# Patient Record
Sex: Male | Born: 1950
Health system: Southern US, Community
[De-identification: ages and names within clinical notes are randomized; demographics above are authoritative.]

## PROBLEM LIST (undated history)

## (undated) HISTORY — PX: FRACTURE SURGERY: SHX138

## (undated) HISTORY — PX: COLONOSCOPY: SHX174

## (undated) HISTORY — PX: TONSILLECTOMY: SUR1361

---

## 2007-10-11 ENCOUNTER — Ambulatory Visit (HOSPITAL_BASED_OUTPATIENT_CLINIC_OR_DEPARTMENT_OTHER): Admission: RE | Admit: 2007-10-11 | Discharge: 2007-10-12 | Payer: Self-pay | Admitting: Orthopedic Surgery

## 2009-11-20 ENCOUNTER — Observation Stay (HOSPITAL_COMMUNITY): Admission: EM | Admit: 2009-11-20 | Discharge: 2009-11-21 | Payer: Self-pay | Admitting: Emergency Medicine

## 2009-11-20 ENCOUNTER — Encounter (INDEPENDENT_AMBULATORY_CARE_PROVIDER_SITE_OTHER): Payer: Self-pay | Admitting: Internal Medicine

## 2009-11-21 ENCOUNTER — Ambulatory Visit: Payer: Self-pay | Admitting: Surgery

## 2009-11-21 ENCOUNTER — Encounter (INDEPENDENT_AMBULATORY_CARE_PROVIDER_SITE_OTHER): Payer: Self-pay | Admitting: Internal Medicine

## 2011-03-23 LAB — LIPID PANEL
Total CHOL/HDL Ratio: 5.3 RATIO
VLDL: 32 mg/dL (ref 0–40)

## 2011-03-23 LAB — BASIC METABOLIC PANEL
CO2: 28 mEq/L (ref 19–32)
GFR calc non Af Amer: >60 mL/min (ref >60)
Glucose, Bld: 120 mg/dL — ABNORMAL HIGH (ref 70–99)
Potassium: 4.4 mEq/L (ref 3.5–5.1)
Sodium: 136 mEq/L (ref 135–145)

## 2011-03-23 LAB — CARDIAC PANEL(CRET KIN+CKTOT+MB+TROPI)
CK, MB: 2.7 ng/mL (ref 0.3–4.0)
CK, MB: 3.5 ng/mL (ref 0.3–4.0)
Relative Index: 0.6 (ref 0.0–2.5)
Total CK: 507 U/L — ABNORMAL HIGH (ref 7–232)
Total CK: 563 U/L — ABNORMAL HIGH (ref 7–232)

## 2011-03-23 LAB — CBC
Hemoglobin: 14.8 g/dL (ref 13.0–17.0)
MCHC: 34.7 g/dL (ref 30.0–36.0)
RBC: 4.45 MIL/uL (ref 4.22–5.81)
WBC: 12.2 10*3/uL — ABNORMAL HIGH (ref 4.0–10.5)

## 2011-03-23 LAB — DIFFERENTIAL
Basophils Relative: 0 % (ref 0–1)
Lymphs Abs: 1 10*3/uL (ref 0.7–4.0)
Monocytes Absolute: 0.5 10*3/uL (ref 0.1–1.0)
Monocytes Relative: 5 % (ref 3–12)
Neutro Abs: 10.5 10*3/uL — ABNORMAL HIGH (ref 1.7–7.7)

## 2011-05-04 NOTE — Op Note (Signed)
NAME:  Reginald Bauer, Reginald Bauer                 ACCOUNT NO.:  [id number]   MEDICAL RECORD NO.:  [id number]          PATIENT TYPE:  AMB   LOCATION:  DSC                          FACILITY:  [facility]   PHYSICIAN:  Leonides Grills, M.D.     DATE OF BIRTH:  1951-05-21   DATE OF PROCEDURE:  10/11/2007  DATE OF DISCHARGE:                               OPERATIVE REPORT   PREOPERATIVE DIAGNOSES:  1. Left fibular malunion.  2. Left tibial malunion.  3. Loose body left anterolateral ankle.  4. Anterior ankle impingement secondary to anterior distal tibial      osteophytes.   POSTOPERATIVE DIAGNOSES:  1. Left fibular malunion.  2. Left tibial malunion.  3. Loose body left anterolateral ankle.  4. Anterior ankle impingement secondary to anterior distal tibial      osteophytes.   OPERATION:  1. Left Weber closing wedge tib-fib osteotomy with internal fixation.  2. Stress x-rays left ankle.  3. Left ankle arthroscopy.  4. Left ankle arthrotomy with local tenosynovectomy and loose body      removal.  5. Excision anterior distal tibial osteophytes.  6. Local bone graft that was obtained from the osteotomy.  7. Excision talar dorsal talar spur.  8. Dorsal talar spur.   ANESTHESIA:  General.   SURGEON:  Leonides Grills, MD   ASSISTANT:  Evlyn Kanner, P.A.   ESTIMATED BLOOD LOSS:  Minimal.   TOURNIQUET TIME:  2 hours.   COMPLICATIONS:  None.   DISPOSITION:  Stable to PR.   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  He has been walking on this side of his foot almost his  entire life.  He was consented for the above procedure.  All risks which  infection, nerve vessel injury, nonunion, malunion, hardware irritation,  hardware failure, persistent pain, worse pain, prolonged recovery,  stiffness, arthritis and possible future  fusion versus arthroplasty  again were all explained, questions were encouraged and answered.  We  also preoperatively prepped this with tracing paper which was  approximately 1/2 hour to 45 minutes preparation to determine that he  needed approximately 26 degree correction within the tib-fib osteotomy.   PROCEDURE:  The patient brought to the operating room, placed in supine  position after adequate general G tube anesthesia was administered with  popliteal block as well as Ancef gram IV piggyback.  Left lower  extremity was prepped and draped sterile manner over a proximally thigh  tourniquet.  The patient was then placed in a sloppy lateral position  with the operative side up on a beanbag.  All bony props well padded.  Left lower extremity is prepped, draped sterile manner over proximally  placed thigh tourniquet.  Limb was gravity exsanguinated.  Tourniquet  elevated 290 mmHg.  A longitudinal incision over the fibula including  the lateral malleolus was then made.  Dissection was carried down  through skin.  Hemostasis was obtained.  Dissection  was carried down  directly to bone and under C-arm guidance the area of osteotomy was  determined first by determining the joint line and then 2 cm proximal to  this, our first 2 mm K-wire was then placed under C-arm guidance.  We  then from the preoperative plan determined that 26 mm proximal to this a  second wire would be placed.  Again this was a divergent wire and this  was verified under C-arm guidance to be in the proper position.  We then  elevated the soft tissues both anteriorly and posteriorly off tib-fib  area, placed Homans and then with a saw, osteotomized the tibia and  fibula.  This took some time to get this perfectly aligned in both the  AP and lateral planes.  We used a second saw blade to verify that both  cuts were made in the right plane.  Once this was done, the sides were  perfect and apposed excellent.  We then made a  longitudinal incision  over the apex of the osteotomy medially.  Dissection was carried down  through skin.  Hemostasis was obtained.  Saphenous nerve and vein were  identified and protected.  A two hole one-third tubular plate was then  applied as a spring plate with anticipation that the medial cortex would  fracture with the correction.  Once this was applied, two 3.5 mm fully  threaded cortical set screws were then placed in a divergent manner.  We  then closed down the osteotomy with two-point reduction clamp.  This  came together beautifully and was in excellent apposition.  Due to the  fact we did this at the metaphyseal flare, the contact points were  excellent over the lateral and anterior cortex.  We released the portion  of the syndesmosis of the proximal fibula so that we could appose the  fibula in proper position.  This was done with a two-point reduction  clamp as well.  We then applied a four-hole stacked one-third tubular  plate over the lateral malleolus fibula area.  We then placed four 3.5-  mm fully threaded cortical set screws using 4.5-mm drill hole  respectively.  This was placed in a compression manner.  This compressed  the fibula nicely and held the bones well apposed.  We then removed the  clamp off the tibial surface and then placed a three-hole one third  tubular plate over this area as well.  This was then again placed a  compression manner and held the osteotomy compressed in an anatomic  position.  We then tightened down the medial plate and again held  everything in a beautiful position.  We then copiously irrigated the  area with normal saline.  This took approximately two hours.  We  released the tourniquet.  There was no pulsatile bleeding.  There was a  palpable dorsalis pedis pulse and a posterior tib pulse.  The left foot  was warm and pink. Local bone graft that was obtained from the osteotomy  was crushed on the back table as well as bone we took  from the later in  the anterior tibial osteotomy, anterior tibial spur removal, as well as  loose body removal, as well.  We placed this on back table and later  placed this at the graft site.  We then marked out the anatomical  landmarks which included anterior tibialis and peroneus tertius and  palpated over where the loose body as on the anterolateral aspect of the  ankle.  Weston Brass and spread technique was then utilized to create the  anteromedial, anterolateral portals. An arthroscope was placed to verify  extent of the spur and also to verify the loose body itself.  We did not  perform an extensive.  This was a minimal debridement with the scope by  also extending the anterolateral wound approximately to 2.5 cm.  Once  this was done using the scope, we were able within the wound, we were  able to remove not only the anterolateral loose body but also removed  the anterior distal tibial spurs as well with a curved quarter inch  osteotome.  This was done and as well as an open tenosynovectomy of the  ankle with a synovectomy rongeur.  We then ranged the ankle and the  range of motion was excellent.  There was no impinging areas anteriorly.  There was also a dorsal talar spur medially that was removed with the  curved quarter inch osteotome as well.  Once this was done, the area was  copiously be with normal saline from the scope and this was visually was  also verified visually as well.  Stress x-rays were obtained prior to  this and showed no gross motion across the osteotomy site fixation,  proper position excellent alignment as well.  Clinically the leg was in  excellent position.  The heel was in excellent position.  We did not  require any further osteotomies at this point.  He had very supple foot  especially the subtalar joint and first ray area.  He did not have a  significant or any plantar flexed first ray that would require osteotomy  at this point we then took the bone graft  that we obtained locally from  the above areas and put stress strain relieving bone graft over the  osteotomy sites as well.  This was packed into place after the areas  were copiously irrigated with normal saline.  Capsule anterolaterally  was closed with 3-0 Vicryl, protecting the neurovascular structures  medially and peroneus tertius tendon.  Subcu was closed with 2-0 and 3-0  Vicryl over all wounds.  Skin was closed 4-0 nylon over wounds.  Sterile  dressing was applied.  Modified Jones dressing was applied with the  ankle in neutral dorsiflexion.  The patient was stable to PR.      Leonides Grills, M.D.  Electronically Signed     PB/MEDQ  D:  10/11/2007  T:  10/12/2007  Job:  [id number]

## 2011-09-29 LAB — POCT HEMOGLOBIN-HEMACUE
Hemoglobin: 15.9
Operator id: 128471

## 2014-10-31 ENCOUNTER — Emergency Department (HOSPITAL_COMMUNITY): Payer: BC Managed Care – PPO

## 2014-10-31 ENCOUNTER — Emergency Department (HOSPITAL_COMMUNITY)
Admission: EM | Admit: 2014-10-31 | Discharge: 2014-10-31 | Disposition: A | Discharge status: Home / Self Care | Payer: BC Managed Care – PPO | Attending: Emergency Medicine | Admitting: Emergency Medicine

## 2014-10-31 ENCOUNTER — Encounter (HOSPITAL_COMMUNITY): Payer: Self-pay | Admitting: Emergency Medicine

## 2014-10-31 DIAGNOSIS — Z79899 Other long term (current) drug therapy: Secondary | ICD-10-CM | POA: Diagnosis not present

## 2014-10-31 DIAGNOSIS — R42 Dizziness and giddiness: Secondary | ICD-10-CM | POA: Diagnosis not present

## 2014-10-31 DIAGNOSIS — R55 Syncope and collapse: Secondary | ICD-10-CM | POA: Insufficient documentation

## 2014-10-31 DIAGNOSIS — Z8781 Personal history of (healed) traumatic fracture: Secondary | ICD-10-CM | POA: Insufficient documentation

## 2014-10-31 DIAGNOSIS — Z7982 Long term (current) use of aspirin: Secondary | ICD-10-CM | POA: Diagnosis not present

## 2014-10-31 DIAGNOSIS — Z880 Allergy status to penicillin: Secondary | ICD-10-CM | POA: Diagnosis not present

## 2014-10-31 DIAGNOSIS — R110 Nausea: Secondary | ICD-10-CM | POA: Insufficient documentation

## 2014-10-31 DIAGNOSIS — R1010 Upper abdominal pain, unspecified: Secondary | ICD-10-CM | POA: Diagnosis not present

## 2014-10-31 DIAGNOSIS — Z9889 Other specified postprocedural states: Secondary | ICD-10-CM | POA: Insufficient documentation

## 2014-10-31 DIAGNOSIS — Z9089 Acquired absence of other organs: Secondary | ICD-10-CM | POA: Diagnosis not present

## 2014-10-31 DIAGNOSIS — R109 Unspecified abdominal pain: Secondary | ICD-10-CM

## 2014-10-31 LAB — URINALYSIS, ROUTINE W REFLEX MICROSCOPIC
Bilirubin Urine: NEGATIVE
GLUCOSE, UA: NEGATIVE mg/dL
HGB URINE DIPSTICK: NEGATIVE
KETONES UR: 40 mg/dL — AB
LEUKOCYTES UA: NEGATIVE
Nitrite: NEGATIVE
PROTEIN: NEGATIVE mg/dL
Specific Gravity, Urine: 1.021 (ref 1.005–1.030)
Urobilinogen, UA: 0.2 mg/dL (ref 0.0–1.0)
pH: 5 (ref 5.0–8.0)

## 2014-10-31 LAB — CBC WITH DIFFERENTIAL/PLATELET
Basophils Absolute: 0 10*3/uL (ref 0.0–0.1)
Basophils Relative: 0 % (ref 0–1)
EOS ABS: 0 10*3/uL (ref 0.0–0.7)
EOS PCT: 0 % (ref 0–5)
HEMATOCRIT: 41.9 % (ref 39.0–52.0)
HEMOGLOBIN: 14.2 g/dL (ref 13.0–17.0)
LYMPHS ABS: 0.7 10*3/uL (ref 0.7–4.0)
Lymphocytes Relative: 8 % — ABNORMAL LOW (ref 12–46)
MCH: 30.7 pg (ref 26.0–34.0)
MCHC: 33.9 g/dL (ref 30.0–36.0)
MCV: 90.5 fL (ref 78.0–100.0)
MONOS PCT: 7 % (ref 3–12)
Monocytes Absolute: 0.5 10*3/uL (ref 0.1–1.0)
NEUTROS PCT: 85 % — AB (ref 43–77)
Neutro Abs: 6.8 10*3/uL (ref 1.7–7.7)
Platelets: 255 10*3/uL (ref 150–400)
RBC: 4.63 MIL/uL (ref 4.22–5.81)
RDW: 13.1 % (ref 11.5–15.5)
WBC: 8.1 10*3/uL (ref 4.0–10.5)

## 2014-10-31 LAB — COMPREHENSIVE METABOLIC PANEL
ALK PHOS: 74 U/L (ref 39–117)
ALT: 16 U/L (ref 0–53)
ANION GAP: 14 (ref 5–15)
AST: 19 U/L (ref 0–37)
Albumin: 3.8 g/dL (ref 3.5–5.2)
BUN: 14 mg/dL (ref 6–23)
CO2: 25 mEq/L (ref 19–32)
Calcium: 9.5 mg/dL (ref 8.4–10.5)
Chloride: 102 mEq/L (ref 96–112)
Creatinine, Ser: 0.98 mg/dL (ref 0.50–1.35)
GFR calc non Af Amer: 85 mL/min — ABNORMAL LOW (ref >90)
GLUCOSE: 106 mg/dL — AB (ref 70–99)
POTASSIUM: 4.3 meq/L (ref 3.7–5.3)
Sodium: 141 mEq/L (ref 137–147)
TOTAL PROTEIN: 7.3 g/dL (ref 6.0–8.3)
Total Bilirubin: 0.5 mg/dL (ref 0.3–1.2)

## 2014-10-31 LAB — TROPONIN I: Troponin I: <0.3 ng/mL (ref <0.30)

## 2014-10-31 LAB — LIPASE, BLOOD: Lipase: 18 U/L (ref 11–59)

## 2014-10-31 MED ORDER — ONDANSETRON HCL 4 MG/2ML IJ SOLN: 4.0000 mg | Freq: Once | INTRAMUSCULAR | Status: DC

## 2014-10-31 MED ORDER — SODIUM CHLORIDE 0.9 % IV BOLUS (SEPSIS)
500.0000 mL | Freq: Once | INTRAVENOUS | Status: AC
  Administered 2014-10-31: 500 mL via INTRAVENOUS

## 2014-10-31 MED ORDER — DICYCLOMINE HCL 20 MG PO TABS: 20.0000 mg | ORAL_TABLET | Freq: Three times a day (TID) | ORAL | Status: DC | PRN

## 2014-10-31 MED ORDER — MORPHINE SULFATE 4 MG/ML IJ SOLN
4.0000 mg | Freq: Once | INTRAMUSCULAR | Status: AC
  Administered 2014-10-31: 4 mg via INTRAVENOUS
  Filled 2014-10-31: qty 1

## 2014-10-31 NOTE — Discharge Instructions (Signed)
Abdominal Pain Many things can cause abdominal pain. Usually, abdominal pain is not caused by a disease and will improve without treatment. It can often be observed and treated at home. Your health care provider will do a physical exam and possibly order blood tests and X-rays to help determine the seriousness of your pain. However, in many cases, more time must pass before a clear cause of the pain can be found. Before that point, your health care provider may not know if you need more testing or further treatment. HOME CARE INSTRUCTIONS  Monitor your abdominal pain for any changes. The following actions may help to alleviate any discomfort you are experiencing:  Only take over-the-counter or prescription medicines as directed by your health care provider.  Do not take laxatives unless directed to do so by your health care provider.  Try a clear liquid diet (broth, tea, or water) as directed by your health care provider. Slowly move to a bland diet as tolerated. SEEK MEDICAL CARE IF:  You have unexplained abdominal pain.  You have abdominal pain associated with nausea or diarrhea.  You have pain when you urinate or have a bowel movement.  You experience abdominal pain that wakes you in the night.  You have abdominal pain that is worsened or improved by eating food.  You have abdominal pain that is worsened with eating fatty foods.  You have a fever. SEEK IMMEDIATE MEDICAL CARE IF:   Your pain does not go away within 2 hours.  You keep throwing up (vomiting).  Your pain is felt only in portions of the abdomen, such as the right side or the left lower portion of the abdomen.  You pass bloody or black tarry stools. MAKE SURE YOU:  Understand these instructions.   Will watch your condition.   Will get help right away if you are not doing well or get worse.  Document Released: 09/15/2005 Document Revised: 12/11/2013 Document Reviewed: 08/15/2013 [facility] Patient Information  2015 [place], [place]. This information is not intended to replace advice given to you by your health care provider. Make sure you discuss any questions you have with your health care provider.  Vasovagal Syncope, Adult Syncope, commonly known as fainting, is a temporary loss of consciousness. It occurs when the blood flow to the brain is reduced. Vasovagal syncope (also called neurocardiogenic syncope) is a fainting spell in which the blood flow to the brain is reduced because of a sudden drop in heart rate and blood pressure. Vasovagal syncope occurs when the brain and the cardiovascular system (blood vessels) do not adequately communicate and respond to each other. This is the most common cause of fainting. It often occurs in response to fear or some other type of emotional or physical stress. The body has a reaction in which the heart starts beating too slowly or the blood vessels expand, reducing blood pressure. This type of fainting spell is generally considered harmless. However, injuries can occur if a person takes a sudden fall during a fainting spell.  CAUSES  Vasovagal syncope occurs when a person's blood pressure and heart rate decrease suddenly, usually in response to a trigger. Many things and situations can trigger an episode. Some of these include:   Pain.   Fear.   The sight of blood or medical procedures, such as blood being drawn from a vein.   Common activities, such as coughing, swallowing, stretching, or going to the bathroom.   Emotional stress.   Prolonged standing, especially in a warm  environment.   Lack of sleep or rest.   Prolonged lack of food.   Prolonged lack of fluids.   Recent illness.  The use of certain drugs that affect blood pressure, such as cocaine, alcohol, marijuana, inhalants, and opiates.  SYMPTOMS  Before the fainting episode, you may:   Feel dizzy or light headed.   Become pale.  Sense that you are going to faint.   Feel like  the room is spinning.   Have tunnel vision, only seeing directly in front of you.   Feel sick to your stomach (nauseous).   See spots or slowly lose vision.   Hear ringing in your ears.   Have a headache.   Feel warm and sweaty.   Feel a sensation of pins and needles. During the fainting spell, you will generally be unconscious for no longer than a couple minutes before waking up and returning to normal. If you get up too quickly before your body can recover, you may faint again. Some twitching or jerky movements may occur during the fainting spell.  DIAGNOSIS  Your caregiver will ask about your symptoms, take a medical history, and perform a physical exam. Various tests may be done to rule out other causes of fainting. These may include blood tests and tests to check the heart, such as electrocardiography, echocardiography, and possibly an electrophysiology study. When other causes have been ruled out, a test may be done to check the body's response to changes in position (tilt table test). TREATMENT  Most cases of vasovagal syncope do not require treatment. Your caregiver may recommend ways to avoid fainting triggers and may provide home strategies for preventing fainting. If you must be exposed to a possible trigger, you can drink additional fluids to help reduce your chances of having an episode of vasovagal syncope. If you have warning signs of an oncoming episode, you can respond by positioning yourself favorably (lying down). If your fainting spells continue, you may be given medicines to prevent fainting. Some medicines may help make you more resistant to repeated episodes of vasovagal syncope. Special exercises or compression stockings may be recommended. In rare cases, the surgical placement of a pacemaker is considered. HOME CARE INSTRUCTIONS   Learn to identify the warning signs of vasovagal syncope.   Sit or lie down at the first warning sign of a fainting spell. If  sitting, put your head down between your legs. If you lie down, swing your legs up in the air to increase blood flow to the brain.   Avoid hot tubs and saunas.  Avoid prolonged standing.  Drink enough fluids to keep your urine clear or pale yellow. Avoid caffeine.  Increase salt in your diet as directed by your caregiver.   If you have to stand for a long time, perform movements such as:   Crossing your legs.   Flexing and stretching your leg muscles.   Squatting.   Moving your legs.   Bending over.   Only take over-the-counter or prescription medicines as directed by your caregiver. Do not suddenly stop any medicines without asking your caregiver first. SEEK MEDICAL CARE IF:   Your fainting spells continue or happen more frequently in spite of treatment.   You lose consciousness for more than a couple minutes.  You have fainting spells during or after exercising or after being startled.   You have new symptoms that occur with the fainting spells, such as:   Shortness of breath.  Chest pain.   Irregular  heartbeat.   You have episodes of twitching or jerky movements that last longer than a few seconds.  You have episodes of twitching or jerky movements without obvious fainting. SEEK IMMEDIATE MEDICAL CARE IF:   You have injuries or bleeding after a fainting spell.   You have episodes of twitching or jerky movements that last longer than 5 minutes.   You have more than one spell of twitching or jerky movements before returning to consciousness after fainting. MAKE SURE YOU:   Understand these instructions.  Will watch your condition.  Will get help right away if you are not doing well or get worse. Document Released: 11/22/2012 Document Reviewed: 11/22/2012 [facility] Patient Information 2015 [place]. This information is not intended to replace advice given to you by your health care provider. Make sure you discuss any questions you have  with your health care provider.

## 2014-10-31 NOTE — ED Notes (Signed)
Pt. Refused wheelchair 

## 2014-10-31 NOTE — ED Notes (Addendum)
63 yo post colonsocopy at 12 noon since then was pale, diaphoretic also decreased appetite. Per EMS pt was on the toilet when they arrived had abdominal cramping. Denies N/V/D. A/o x4   128/80 HR Irreg 40/80 CBG 80

## 2014-10-31 NOTE — ED Notes (Signed)
MD at bedside. 

## 2014-11-01 NOTE — ED Provider Notes (Signed)
CSN: [id number]     Arrival date & time 10/31/14  1717 History   First MD Initiated Contact with Patient 10/31/14 1726     Chief Complaint  Patient presents with  . Loss of Consciousness    Post Colonscopy      (Consider location/radiation/quality/duration/timing/severity/associated sxs/prior Treatment) HPI Patient presents with syncope. He had a colonoscopy earlier today. States since then he has had cramping abdominal pain. He was having this pain and began to feel lightheaded. He then passed out. No chest pain. Some nausea without vomiting. No diarrhea. States it felt as if he had to have a bowel movement and passed out. He's had previous colonoscopies without this response. EMS states his heart rate had been down in the 50s.   History reviewed. No pertinent past medical history. Past Surgical History  Procedure Laterality Date  . Colonoscopy      annually   . Tonsillectomy    . Fracture surgery     No family history on file. History  Substance Use Topics  . Smoking status: Never Smoker   . Smokeless tobacco: Not on file  . Alcohol Use: No    Review of Systems  Constitutional: Positive for fatigue. Negative for activity change and appetite change.  Eyes: Negative for pain.  Respiratory: Negative for chest tightness and shortness of breath.   Cardiovascular: Negative for chest pain and leg swelling.  Gastrointestinal: Positive for nausea and abdominal pain. Negative for vomiting and diarrhea.  Genitourinary: Negative for flank pain.  Musculoskeletal: Negative for back pain and neck stiffness.  Skin: Negative for rash.  Neurological: Positive for syncope. Negative for weakness, numbness and headaches.  Psychiatric/Behavioral: Negative for behavioral problems.      Allergies  Clindamycin/lincomycin and Penicillins  Home Medications   Prior to Admission medications   Medication Sig Start Date End Date Taking? Authorizing Provider  aspirin 81 MG tablet Take 81 mg  by mouth daily.   Yes Historical Provider, MD  levothyroxine (SYNTHROID, LEVOTHROID) 125 MCG tablet Take 125 mcg by mouth daily before breakfast.   Yes Historical Provider, MD  dicyclomine (BENTYL) 20 MG tablet Take 1 tablet (20 mg total) by mouth 3 (three) times daily as needed for spasms. 10/31/14   Jasper Riling. Tyesha Joffe, MD   BP 126/76 mmHg  Pulse 74  Temp(Src) 97.3 F (36.3 C) (Oral)  Resp 14  SpO2 97% Physical Exam  Constitutional: He is oriented to person, place, and time. He appears well-developed and well-nourished.  HENT:  Head: Normocephalic and atraumatic.  Eyes: EOM are normal. Pupils are equal, round, and reactive to light.  Neck: Normal range of motion. Neck supple.  Cardiovascular: Normal rate, regular rhythm and normal heart sounds.   No murmur heard. Pulmonary/Chest: Effort normal and breath sounds normal.  Abdominal: Soft. He exhibits no distension and no mass. There is tenderness. There is no rebound and no guarding.  Upper abdominal tenderness without rebound or guarding.  Musculoskeletal: Normal range of motion. He exhibits no edema.  Neurological: He is alert and oriented to person, place, and time. No cranial nerve deficit.  Skin: Skin is warm and dry.  Psychiatric: He has a normal mood and affect.  Nursing note and vitals reviewed.   ED Course  Procedures (including critical care time) Labs Review Labs Reviewed  CBC WITH DIFFERENTIAL - Abnormal; Notable for the following:    Neutrophils Relative % 85 (*)    Lymphocytes Relative 8 (*)    All other components within normal limits  COMPREHENSIVE METABOLIC PANEL - Abnormal; Notable for the following:    Glucose, Bld 106 (*)    GFR calc non Af Amer 85 (*)    All other components within normal limits  URINALYSIS, ROUTINE W REFLEX MICROSCOPIC - Abnormal; Notable for the following:    Color, Urine AMBER (*)    APPearance CLOUDY (*)    Ketones, ur 40 (*)    All other components within normal limits  LIPASE,  BLOOD  TROPONIN I    Imaging Review Dg Abd 2 Views  10/31/2014   CLINICAL DATA:  Diffuse abdominal pain after colonoscopy this morning. Weakness.  EXAM: ABDOMEN - 2 VIEW  COMPARISON:  None.  FINDINGS: There is diffuse gas distention of the colon with air-fluid levels. No free air.  IMPRESSION: Diffuse gaseous distension of the colon with associated scattered fluid, expected findings after colonoscopy earlier today.   Electronically Signed   By: Lorin Picket M.D.   On: 10/31/2014 18:58     EKG Interpretation None      MDM   Final diagnoses:  Abdominal pain  Vasovagal syncope    Patient with syncope. Likely vasovagal. Lab work and EKG reassuring. Doubt severe intra-abdominal pathology. May be a dehydration component. Patient feels better after IV fluids. Will discharge home.    Jasper Riling. Alvino Chapel, Jay 11/01/14 [phone]

## 2015-01-09 DIAGNOSIS — M542 Cervicalgia: Secondary | ICD-10-CM | POA: Diagnosis not present

## 2015-01-09 DIAGNOSIS — Z1389 Encounter for screening for other disorder: Secondary | ICD-10-CM | POA: Diagnosis not present

## 2015-09-10 DIAGNOSIS — Z23 Encounter for immunization: Secondary | ICD-10-CM | POA: Diagnosis not present

## 2015-10-28 DIAGNOSIS — H43813 Vitreous degeneration, bilateral: Secondary | ICD-10-CM | POA: Diagnosis not present

## 2015-10-28 DIAGNOSIS — H2513 Age-related nuclear cataract, bilateral: Secondary | ICD-10-CM | POA: Diagnosis not present

## 2016-02-24 DIAGNOSIS — Z125 Encounter for screening for malignant neoplasm of prostate: Secondary | ICD-10-CM | POA: Diagnosis not present

## 2016-02-24 DIAGNOSIS — E039 Hypothyroidism, unspecified: Secondary | ICD-10-CM | POA: Diagnosis not present

## 2016-02-24 DIAGNOSIS — Z131 Encounter for screening for diabetes mellitus: Secondary | ICD-10-CM | POA: Diagnosis not present

## 2016-02-24 DIAGNOSIS — E7800 Pure hypercholesterolemia, unspecified: Secondary | ICD-10-CM | POA: Diagnosis not present

## 2016-02-28 DIAGNOSIS — S61411A Laceration without foreign body of right hand, initial encounter: Secondary | ICD-10-CM | POA: Diagnosis not present

## 2016-03-09 DIAGNOSIS — L814 Other melanin hyperpigmentation: Secondary | ICD-10-CM | POA: Diagnosis not present

## 2016-03-09 DIAGNOSIS — L821 Other seborrheic keratosis: Secondary | ICD-10-CM | POA: Diagnosis not present

## 2016-03-09 DIAGNOSIS — D1801 Hemangioma of skin and subcutaneous tissue: Secondary | ICD-10-CM | POA: Diagnosis not present

## 2016-03-09 DIAGNOSIS — L570 Actinic keratosis: Secondary | ICD-10-CM | POA: Diagnosis not present

## 2016-04-22 DIAGNOSIS — M5416 Radiculopathy, lumbar region: Secondary | ICD-10-CM | POA: Diagnosis not present

## 2016-05-05 DIAGNOSIS — E039 Hypothyroidism, unspecified: Secondary | ICD-10-CM | POA: Diagnosis not present

## 2016-05-05 DIAGNOSIS — Z0000 Encounter for general adult medical examination without abnormal findings: Secondary | ICD-10-CM | POA: Diagnosis not present

## 2016-05-05 DIAGNOSIS — Z23 Encounter for immunization: Secondary | ICD-10-CM | POA: Diagnosis not present

## 2016-08-04 DIAGNOSIS — L600 Ingrowing nail: Secondary | ICD-10-CM | POA: Diagnosis not present

## 2016-09-02 DIAGNOSIS — Z23 Encounter for immunization: Secondary | ICD-10-CM | POA: Diagnosis not present

## 2017-03-08 DIAGNOSIS — L821 Other seborrheic keratosis: Secondary | ICD-10-CM | POA: Diagnosis not present

## 2017-03-08 DIAGNOSIS — L570 Actinic keratosis: Secondary | ICD-10-CM | POA: Diagnosis not present

## 2017-03-08 DIAGNOSIS — D1801 Hemangioma of skin and subcutaneous tissue: Secondary | ICD-10-CM | POA: Diagnosis not present

## 2017-03-08 DIAGNOSIS — L814 Other melanin hyperpigmentation: Secondary | ICD-10-CM | POA: Diagnosis not present

## 2017-04-18 DIAGNOSIS — M19072 Primary osteoarthritis, left ankle and foot: Secondary | ICD-10-CM | POA: Diagnosis not present

## 2017-04-18 DIAGNOSIS — M25572 Pain in left ankle and joints of left foot: Secondary | ICD-10-CM | POA: Diagnosis not present

## 2017-04-18 DIAGNOSIS — M19172 Post-traumatic osteoarthritis, left ankle and foot: Secondary | ICD-10-CM | POA: Diagnosis not present

## 2017-04-18 DIAGNOSIS — G8929 Other chronic pain: Secondary | ICD-10-CM | POA: Diagnosis not present

## 2017-05-17 DIAGNOSIS — Z125 Encounter for screening for malignant neoplasm of prostate: Secondary | ICD-10-CM | POA: Diagnosis not present

## 2017-05-17 DIAGNOSIS — Z23 Encounter for immunization: Secondary | ICD-10-CM | POA: Diagnosis not present

## 2017-05-17 DIAGNOSIS — Z1159 Encounter for screening for other viral diseases: Secondary | ICD-10-CM | POA: Diagnosis not present

## 2017-05-17 DIAGNOSIS — E7800 Pure hypercholesterolemia, unspecified: Secondary | ICD-10-CM | POA: Diagnosis not present

## 2017-05-17 DIAGNOSIS — E039 Hypothyroidism, unspecified: Secondary | ICD-10-CM | POA: Diagnosis not present

## 2017-05-17 DIAGNOSIS — Z0000 Encounter for general adult medical examination without abnormal findings: Secondary | ICD-10-CM | POA: Diagnosis not present

## 2017-05-17 DIAGNOSIS — Z131 Encounter for screening for diabetes mellitus: Secondary | ICD-10-CM | POA: Diagnosis not present

## 2017-07-22 DIAGNOSIS — H43391 Other vitreous opacities, right eye: Secondary | ICD-10-CM | POA: Diagnosis not present

## 2017-07-22 DIAGNOSIS — H2513 Age-related nuclear cataract, bilateral: Secondary | ICD-10-CM | POA: Diagnosis not present

## 2017-09-14 DIAGNOSIS — Z23 Encounter for immunization: Secondary | ICD-10-CM | POA: Diagnosis not present

## 2018-03-23 DIAGNOSIS — L821 Other seborrheic keratosis: Secondary | ICD-10-CM | POA: Diagnosis not present

## 2018-03-23 DIAGNOSIS — L738 Other specified follicular disorders: Secondary | ICD-10-CM | POA: Diagnosis not present

## 2018-03-23 DIAGNOSIS — L819 Disorder of pigmentation, unspecified: Secondary | ICD-10-CM | POA: Diagnosis not present

## 2018-03-23 DIAGNOSIS — L111 Transient acantholytic dermatosis [Grover]: Secondary | ICD-10-CM | POA: Diagnosis not present

## 2018-03-23 DIAGNOSIS — D229 Melanocytic nevi, unspecified: Secondary | ICD-10-CM | POA: Diagnosis not present

## 2018-03-23 DIAGNOSIS — L814 Other melanin hyperpigmentation: Secondary | ICD-10-CM | POA: Diagnosis not present

## 2018-03-23 DIAGNOSIS — L570 Actinic keratosis: Secondary | ICD-10-CM | POA: Diagnosis not present

## 2018-06-28 DIAGNOSIS — Z131 Encounter for screening for diabetes mellitus: Secondary | ICD-10-CM | POA: Diagnosis not present

## 2018-06-28 DIAGNOSIS — Z0000 Encounter for general adult medical examination without abnormal findings: Secondary | ICD-10-CM | POA: Diagnosis not present

## 2018-06-28 DIAGNOSIS — E7800 Pure hypercholesterolemia, unspecified: Secondary | ICD-10-CM | POA: Diagnosis not present

## 2018-06-28 DIAGNOSIS — E039 Hypothyroidism, unspecified: Secondary | ICD-10-CM | POA: Diagnosis not present

## 2018-06-28 DIAGNOSIS — Z125 Encounter for screening for malignant neoplasm of prostate: Secondary | ICD-10-CM | POA: Diagnosis not present

## 2018-07-15 DIAGNOSIS — S30861A Insect bite (nonvenomous) of abdominal wall, initial encounter: Secondary | ICD-10-CM | POA: Diagnosis not present

## 2018-07-15 DIAGNOSIS — W57XXXA Bitten or stung by nonvenomous insect and other nonvenomous arthropods, initial encounter: Secondary | ICD-10-CM | POA: Diagnosis not present

## 2018-07-24 DIAGNOSIS — H2513 Age-related nuclear cataract, bilateral: Secondary | ICD-10-CM | POA: Diagnosis not present

## 2018-07-24 DIAGNOSIS — H43391 Other vitreous opacities, right eye: Secondary | ICD-10-CM | POA: Diagnosis not present

## 2018-07-24 DIAGNOSIS — H0102A Squamous blepharitis right eye, upper and lower eyelids: Secondary | ICD-10-CM | POA: Diagnosis not present

## 2018-09-09 DIAGNOSIS — Z23 Encounter for immunization: Secondary | ICD-10-CM | POA: Diagnosis not present

## 2019-03-16 DIAGNOSIS — D225 Melanocytic nevi of trunk: Secondary | ICD-10-CM | POA: Diagnosis not present

## 2019-03-16 DIAGNOSIS — D485 Neoplasm of uncertain behavior of skin: Secondary | ICD-10-CM | POA: Diagnosis not present

## 2019-03-16 DIAGNOSIS — D229 Melanocytic nevi, unspecified: Secondary | ICD-10-CM | POA: Diagnosis not present

## 2019-03-16 DIAGNOSIS — C44619 Basal cell carcinoma of skin of left upper limb, including shoulder: Secondary | ICD-10-CM | POA: Diagnosis not present

## 2019-03-16 DIAGNOSIS — L570 Actinic keratosis: Secondary | ICD-10-CM | POA: Diagnosis not present

## 2019-03-16 DIAGNOSIS — L821 Other seborrheic keratosis: Secondary | ICD-10-CM | POA: Diagnosis not present

## 2019-03-16 DIAGNOSIS — L720 Epidermal cyst: Secondary | ICD-10-CM | POA: Diagnosis not present

## 2019-03-16 DIAGNOSIS — L814 Other melanin hyperpigmentation: Secondary | ICD-10-CM | POA: Diagnosis not present

## 2019-05-31 DIAGNOSIS — C44619 Basal cell carcinoma of skin of left upper limb, including shoulder: Secondary | ICD-10-CM | POA: Diagnosis not present

## 2019-05-31 DIAGNOSIS — L905 Scar conditions and fibrosis of skin: Secondary | ICD-10-CM | POA: Diagnosis not present

## 2019-07-02 DIAGNOSIS — Z131 Encounter for screening for diabetes mellitus: Secondary | ICD-10-CM | POA: Diagnosis not present

## 2019-07-02 DIAGNOSIS — Z125 Encounter for screening for malignant neoplasm of prostate: Secondary | ICD-10-CM | POA: Diagnosis not present

## 2019-07-02 DIAGNOSIS — R350 Frequency of micturition: Secondary | ICD-10-CM | POA: Diagnosis not present

## 2019-07-02 DIAGNOSIS — E7800 Pure hypercholesterolemia, unspecified: Secondary | ICD-10-CM | POA: Diagnosis not present

## 2019-07-02 DIAGNOSIS — E039 Hypothyroidism, unspecified: Secondary | ICD-10-CM | POA: Diagnosis not present

## 2019-07-02 DIAGNOSIS — Z0000 Encounter for general adult medical examination without abnormal findings: Secondary | ICD-10-CM | POA: Diagnosis not present

## 2019-07-23 DIAGNOSIS — H43391 Other vitreous opacities, right eye: Secondary | ICD-10-CM | POA: Diagnosis not present

## 2019-07-23 DIAGNOSIS — H43811 Vitreous degeneration, right eye: Secondary | ICD-10-CM | POA: Diagnosis not present

## 2019-07-23 DIAGNOSIS — H2513 Age-related nuclear cataract, bilateral: Secondary | ICD-10-CM | POA: Diagnosis not present

## 2019-08-24 DIAGNOSIS — Z23 Encounter for immunization: Secondary | ICD-10-CM | POA: Diagnosis not present

## 2019-10-19 DIAGNOSIS — L738 Other specified follicular disorders: Secondary | ICD-10-CM | POA: Diagnosis not present

## 2019-10-19 DIAGNOSIS — D1801 Hemangioma of skin and subcutaneous tissue: Secondary | ICD-10-CM | POA: Diagnosis not present

## 2019-10-19 DIAGNOSIS — L570 Actinic keratosis: Secondary | ICD-10-CM | POA: Diagnosis not present

## 2019-10-19 DIAGNOSIS — D225 Melanocytic nevi of trunk: Secondary | ICD-10-CM | POA: Diagnosis not present

## 2019-10-19 DIAGNOSIS — D485 Neoplasm of uncertain behavior of skin: Secondary | ICD-10-CM | POA: Diagnosis not present

## 2019-10-19 DIAGNOSIS — Z85828 Personal history of other malignant neoplasm of skin: Secondary | ICD-10-CM | POA: Diagnosis not present

## 2019-10-19 DIAGNOSIS — L821 Other seborrheic keratosis: Secondary | ICD-10-CM | POA: Diagnosis not present

## 2020-01-04 DIAGNOSIS — L309 Dermatitis, unspecified: Secondary | ICD-10-CM | POA: Diagnosis not present

## 2020-01-10 ENCOUNTER — Ambulatory Visit: Payer: Medicare Other | Attending: Internal Medicine

## 2020-01-10 DIAGNOSIS — Z23 Encounter for immunization: Secondary | ICD-10-CM | POA: Insufficient documentation

## 2020-01-10 NOTE — Progress Notes (Signed)
   Covid-19 Vaccination Clinic  Name:  Reginald Bauer    MRN: [id number] DOB: 21-May-1951  01/10/2020  Reginald Bauer was observed post Covid-19 immunization for 15 minutes without incidence. He was provided with Vaccine Information Sheet and instruction to access the V-Safe system.   Reginald Bauer was instructed to call 911 with any severe reactions post vaccine: Marland Kitchen Difficulty breathing  . Swelling of your face and throat  . A fast heartbeat  . A bad rash all over your body  . Dizziness and weakness    Immunizations Administered    Name Date Dose VIS Date Route   Pfizer COVID-19 Vaccine 01/10/2020  8:23 AM 0.3 mL 11/30/2019 Intramuscular   Manufacturer: [place]   Lot: [id number]   [place]: [id number]

## 2020-01-31 ENCOUNTER — Ambulatory Visit: Payer: Medicare Other | Attending: Internal Medicine

## 2020-01-31 DIAGNOSIS — Z23 Encounter for immunization: Secondary | ICD-10-CM

## 2020-01-31 NOTE — Progress Notes (Signed)
   Covid-19 Vaccination Clinic  Name:  Reginald Bauer    MRN: [id number] DOB: 05-Oct-1951  01/31/2020  Mr. [name] was observed post Covid-19 immunization for 15 minutes without incidence. He was provided with Vaccine Information Sheet and instruction to access the V-Safe system.   [name] was instructed to call 911 with any severe reactions post vaccine: Marland Kitchen Difficulty breathing  . Swelling of your face and throat  . A fast heartbeat  . A bad rash all over your body  . Dizziness and weakness    Immunizations Administered    Name Date Dose VIS Date Route   Pfizer COVID-19 Vaccine 01/31/2020  8:16 AM 0.3 mL 11/30/2019 Intramuscular   Manufacturer: [place]   Lot: [id number]   [place]: [id number]

## 2020-02-20 ENCOUNTER — Encounter (HOSPITAL_COMMUNITY): Payer: Self-pay | Admitting: Emergency Medicine

## 2020-02-20 ENCOUNTER — Other Ambulatory Visit: Payer: Self-pay

## 2020-02-20 ENCOUNTER — Emergency Department (HOSPITAL_COMMUNITY): Payer: Medicare Other

## 2020-02-20 ENCOUNTER — Emergency Department (HOSPITAL_COMMUNITY)
Admission: EM | Admit: 2020-02-20 | Discharge: 2020-02-20 | Disposition: A | Discharge status: Home / Self Care | Payer: Medicare Other | Attending: Emergency Medicine | Admitting: Emergency Medicine

## 2020-02-20 DIAGNOSIS — Z7982 Long term (current) use of aspirin: Secondary | ICD-10-CM | POA: Insufficient documentation

## 2020-02-20 DIAGNOSIS — Z79899 Other long term (current) drug therapy: Secondary | ICD-10-CM | POA: Insufficient documentation

## 2020-02-20 DIAGNOSIS — R55 Syncope and collapse: Secondary | ICD-10-CM | POA: Diagnosis not present

## 2020-02-20 LAB — CBC
HCT: 40.2 % (ref 39.0–52.0)
Hemoglobin: 13.2 g/dL (ref 13.0–17.0)
MCH: 29.8 pg (ref 26.0–34.0)
MCHC: 32.8 g/dL (ref 30.0–36.0)
MCV: 90.7 fL (ref 80.0–100.0)
Platelets: 295 K/uL (ref 150–400)
RBC: 4.43 MIL/uL (ref 4.22–5.81)
RDW: 12.6 % (ref 11.5–15.5)
WBC: 4.7 K/uL (ref 4.0–10.5)
nRBC: 0 % (ref 0.0–0.2)

## 2020-02-20 LAB — BASIC METABOLIC PANEL
Anion gap: 9 (ref 5–15)
BUN: 17 mg/dL (ref 8–23)
CO2: 25 mmol/L (ref 22–32)
Calcium: 9.2 mg/dL (ref 8.9–10.3)
Chloride: 103 mmol/L (ref 98–111)
Creatinine, Ser: 1.01 mg/dL (ref 0.61–1.24)
GFR calc Af Amer: >60 mL/min (ref >60)
GFR calc non Af Amer: >60 mL/min (ref >60)
Glucose, Bld: 106 mg/dL — ABNORMAL HIGH (ref 70–99)
Potassium: 3.9 mmol/L (ref 3.5–5.1)
Sodium: 137 mmol/L (ref 135–145)

## 2020-02-20 MED ORDER — SODIUM CHLORIDE 0.9% FLUSH: 3.0000 mL | Freq: Once | INTRAVENOUS | Status: DC

## 2020-02-20 NOTE — ED Provider Notes (Signed)
[facility] EMERGENCY DEPARTMENT Provider Note   CSN: [id number] Arrival date & time: 02/20/20  1224     History Chief Complaint  Patient presents with  . Near Syncope    Reginald Bauer is a 69 y.o. male.  69yo male brought in by EMS for syncopal episode. Patient states he was at work, works at [employer], received a phone call that was very upsetting and felt lightheaded so he put his head down on the desk.  Patient reports the next thing he remembers is someone from the school coming over and shaking him to wake him.  Patient states staff assisted him to the ground and after lying flat on the ground for about 10 minutes he felt like he was back to baseline.  At that time, the ambulance had arrived, assessed his vitals, will nurse recommended patient come to the emergency room for evaluation.  Patient reports loss of bladder control with this episode, no loss of bowel control, did not bite his tongue, no history of seizures previously.  Patient reports having a similar event about 5 years ago, was seen in this emergency room at that time.  Patient believes he may have had another episode about 5 years before that but does not remember the details.  Patient denies any chest pain, shortness of breath or other complaints today.  Cardiac history.  Patient states he has been ambulatory to the bathroom several times since the episode today and has not experienced any dizziness or lightheaded sensation.  Denies changes in vision, speech, gait.  No other complaints or concerns today.        History reviewed. No pertinent past medical history.  There are no problems to display for this patient.   Past Surgical History:  Procedure Laterality Date  . COLONOSCOPY     annually   . FRACTURE SURGERY    . TONSILLECTOMY         History reviewed. No pertinent family history.  Social History   Tobacco Use  . Smoking status: Never Smoker  Substance Use Topics    . Alcohol use: No  . Drug use: Not on file    Home Medications Prior to Admission medications   Medication Sig Start Date End Date Taking? Authorizing Provider  aspirin 81 MG tablet Take 81 mg by mouth daily.    [provider]  dicyclomine (BENTYL) 20 MG tablet Take 1 tablet (20 mg total) by mouth 3 (three) times daily as needed for spasms. 10/31/14   Davonna Belling, MD  levothyroxine (SYNTHROID, LEVOTHROID) 125 MCG tablet Take 125 mcg by mouth daily before breakfast.    [provider]    Allergies    Clindamycin/lincomycin and Penicillins  Review of Systems   Review of Systems  Constitutional: Negative for chills, diaphoresis and fever.  Eyes: Negative for visual disturbance.  Gastrointestinal: Negative for abdominal pain, constipation, diarrhea, nausea and vomiting.  Musculoskeletal: Negative for arthralgias and myalgias.  Skin: Negative for rash and wound.  Allergic/Immunologic: Negative for immunocompromised state.  Neurological: Positive for light-headedness. Negative for speech difficulty and weakness.  Psychiatric/Behavioral: Negative for confusion.  All other systems reviewed and are negative.   Physical Exam Updated Vital Signs BP 133/84 (BP Location: Left Arm)   Pulse 64   Temp 98.5 F (36.9 C) (Oral)   Resp 16   SpO2 99%   Physical Exam Vitals and nursing note reviewed.  Constitutional:      General: He is  not in acute distress.    Appearance: He is well-developed. He is not diaphoretic.  HENT:     Head: Normocephalic and atraumatic.  Eyes:     Extraocular Movements: Extraocular movements intact.     Pupils: Pupils are equal, round, and reactive to light.  Cardiovascular:     Rate and Rhythm: Normal rate and regular rhythm.     Pulses: Normal pulses.     Heart sounds: Normal heart sounds.  Pulmonary:     Effort: Pulmonary effort is normal.     Breath sounds: Normal breath sounds.  Abdominal:     Palpations: Abdomen is soft.      Tenderness: There is no abdominal tenderness.  Musculoskeletal:     Right lower leg: No edema.     Left lower leg: No edema.  Skin:    General: Skin is warm and dry.     Findings: No erythema or rash.  Neurological:     Mental Status: He is alert and oriented to person, place, and time.     Cranial Nerves: No cranial nerve deficit.     Sensory: No sensory deficit.     Motor: No weakness.     Gait: Gait normal.  Psychiatric:        Behavior: Behavior normal.     ED Results / Procedures / Treatments   Labs (all labs ordered are listed, but only abnormal results are displayed) Labs Reviewed  BASIC METABOLIC PANEL - Abnormal; Notable for the following components:      Result Value   Glucose, Bld 106 (*)    All other components within normal limits  CBC    EKG EKG Interpretation  Date/Time:  Wednesday February 20 2020 12:24:36 EST Ventricular Rate:  62 PR Interval:  202 QRS Duration: 96 QT Interval:  426 QTC Calculation: 432 R Axis:   18 Text Interpretation: Normal sinus rhythm Normal ECG No significant change since last tracing Confirmed by Wandra Arthurs [phone]) on 02/20/2020 3:26:04 PM   Radiology CT Head Wo Contrast  Result Date: 02/20/2020 CLINICAL DATA:  69 year old male with syncope. Concern for transient ischemic attack. EXAM: CT HEAD WITHOUT CONTRAST TECHNIQUE: Contiguous axial images were obtained from the base of the skull through the vertex without intravenous contrast. COMPARISON:  Head CT dated 11/19/2009. FINDINGS: Brain: There is mild age-related atrophy and chronic microvascular ischemic changes. Small old right lentiform nucleus lacunar infarct. There is no acute intracranial hemorrhage. No mass effect or midline shift no extra-axial fluid collection. Vascular: No hyperdense vessel or unexpected calcification. Skull: Normal. Negative for fracture or focal lesion. Sinuses/Orbits: No acute finding. Other: None IMPRESSION: 1. No acute intracranial pathology. 2.  Age-related atrophy and chronic microvascular ischemic changes. Electronically Signed   By: Anner Crete M.D.   On: 02/20/2020 17:34    Procedures Procedures (including critical care time)  Medications Ordered in ED Medications  sodium chloride flush (NS) 0.9 % injection 3 mL (has no administration in time range)    ED Course  I have reviewed the triage vital signs and the nursing notes.  Pertinent labs & imaging results that were available during my care of the patient were reviewed by me and considered in my medical decision making (see chart for details).  Clinical Course as of Feb 20 1819  Wed Feb 20, 6448  [id number] 69 year old male with no significant past medical history presents with complaint of possible syncopal episode today.  Patient states that he received some bad news via  telephone today and felt lightheaded, put his head down on his desk and may have passed out.  Reports loss of bladder control with this episode.  Reports 2 prior episodes, one 5 years ago and one possibly 10 years ago similar to this episode today.  Patient has had an uneventful wait while in the emergency room and has felt at baseline the entire time.  Orthostatic vitals were checked and are normal.  Labs are reassuring including a CBC and a BMP.  CT of the head is unremarkable, EKG normal.  Discussed with Dr. Darl Householder, ER attending, will refer to neurology for consideration of possible EEG with report of loss of bladder control with his episode today versus vasovagal syncope.  Patient to return to ER for new or worsening symptoms.   [LM]    Clinical Course User Index [LM] Roque Lias   MDM Rules/Calculators/A&P                      Final Clinical Impression(s) / ED Diagnoses Final diagnoses:  Syncope and collapse    Rx / DC Orders ED Discharge Orders    None       Tacy Learn, PA-C 02/20/20 1820    Drenda Freeze, MD 02/23/20 [phone]

## 2020-02-20 NOTE — Discharge Instructions (Addendum)
Your work up today is reassuring.  CT of the head is unremarkable.  EKG is normal, lab work including a CBC and BMP are both normal.  Recommend follow-up with neurology for further evaluation as discussed.  Return to the emergency room for any further episodes while awaiting your work-up.

## 2020-02-20 NOTE — ED Triage Notes (Signed)
Pt arrives to ED from work with complaints of an syncopal episode. Per [facility] patient was sitting atr his desk when he got a phone call with "bad news" that patient ha snot elaborated on, that caused him to get dizzy and black out. Staff at work found patient slumped over in chair with an unknown amount of time that patient was passed out. Staff moved patient to floor and sat him up and he began to regain consciousness. Patient denies headache, seizure like activity but does claim he his generally weak.

## 2020-02-21 ENCOUNTER — Encounter: Payer: Self-pay | Admitting: Neurology

## 2020-02-27 DIAGNOSIS — F419 Anxiety disorder, unspecified: Secondary | ICD-10-CM | POA: Diagnosis not present

## 2020-03-13 DIAGNOSIS — F4325 Adjustment disorder with mixed disturbance of emotions and conduct: Secondary | ICD-10-CM | POA: Diagnosis not present

## 2020-03-25 ENCOUNTER — Encounter: Payer: Self-pay | Admitting: Neurology

## 2020-03-25 ENCOUNTER — Other Ambulatory Visit: Payer: Self-pay

## 2020-03-25 ENCOUNTER — Ambulatory Visit (INDEPENDENT_AMBULATORY_CARE_PROVIDER_SITE_OTHER): Payer: Medicare Other | Admitting: Neurology

## 2020-03-25 VITALS — BP 138/82 | HR 61 | Ht 73.0 in | Wt 207.6 lb

## 2020-03-25 DIAGNOSIS — R55 Syncope and collapse: Secondary | ICD-10-CM | POA: Diagnosis not present

## 2020-03-25 DIAGNOSIS — F4325 Adjustment disorder with mixed disturbance of emotions and conduct: Secondary | ICD-10-CM | POA: Diagnosis not present

## 2020-03-25 NOTE — Progress Notes (Signed)
NEUROLOGY CONSULTATION NOTE  Reginald Bauer MRN: [id number] DOB: May 10, 1951  Referring provider: Dr. Shirlyn Goltz (ER) Primary care provider: Dr. Zack Seal  Reason for consult:  syncope  Dear Dr Darl Householder:  Thank you for your kind referral of Reginald Bauer for consultation of the above symptoms. Although his history is well known to you, please allow me to reiterate it for the purpose of our medical record. He is alone in the office today. Records and images were personally reviewed where available.   HISTORY OF PRESENT ILLNESS: This is a 69 year old right-handed man with a history of hypothyroidism presenting for evaluation of syncope last 02/20/2020. He was in his usual state of health, sitting at his desk at work when he received a phone call with distressing news. He recalls feeling lightheaded and laying his head down the table, then his next recollection was someone shaking him. He had urinary incontinence. He was initially disoriented and was instructed to lay on the floor. He felt fine within a few minutes, no focal weakness, headache, tongue bite. School nurse was saying he was unresponsive, he recalls feeling groggy. The episode occurred mid-morning, he had eaten breakfast that morning. No sleep deprivation or alcohol the night prior. He does not recall having any chest pain, palpitations, diaphoresis. He recalls 2 prior episodes of passing out, he was in the ER in 10/2014 after a syncopal episode after colonoscopy. Once he got home, he recalls sitting then feeling lightheaded and passing out, per EMS heart rate was in the 50s. He had another syncopal episode around 10 years ago, also after receiving distressing information. He denies any frequent episodes of feeling lightheaded. Occasionally when he bends his head down to the faucet he may feel the same sensation, he raises his head and sensation resolves. He denies any staring/unresponsive episodes, no olfactory/gustatory hallucinations,  deja vu, rising epigastric sensation, focal numbness/tingling/weakness, myoclonic jerks. He denies any headaches, diplopia, dysarthria/dysphaiga, neck/back pain, bowel/bladder dysfunction. He fell out of a tree in the 4th grade and has had left leg discrepancy since then. He had a normal birth and early development.  There is no history of febrile convulsions, CNS infections such as meningitis/encephalitis, significant traumatic brain injury, neurosurgical procedures, or family history of seizures.  Diagnostic Data:  CBC and BMP normal I personally reviewed head CT without contrast 02/2020 no acute changes, there was age-related atrophy and chronic microvascular disease EKG NSR  PAST MEDICAL HISTORY: History reviewed. No pertinent past medical history.  PAST SURGICAL HISTORY: Past Surgical History:  Procedure Laterality Date  . COLONOSCOPY     annually   . FRACTURE SURGERY    . TONSILLECTOMY      MEDICATIONS: Current Outpatient Medications on File Prior to Visit  Medication Sig Dispense Refill  . levothyroxine (SYNTHROID, LEVOTHROID) 125 MCG tablet Take 125 mcg by mouth daily before breakfast.     No current facility-administered medications on file prior to visit.    ALLERGIES: Allergies  Allergen Reactions  . Clindamycin/Lincomycin Other (See Comments)  . Penicillins Other (See Comments)    Gland swelling     FAMILY HISTORY: History reviewed. No pertinent family history.  SOCIAL HISTORY: Social History   Socioeconomic History  . Marital status: Married    Spouse name: Not on file  . Number of children: Not on file  . Years of education: Not on file  . Highest education level: Not on file  Occupational History  . Not on file  Tobacco Use  .  Smoking status: Never Smoker  . Smokeless tobacco: Never Used  Substance and Sexual Activity  . Alcohol use: No  . Drug use: Never  . Sexual activity: Not on file  Other Topics Concern  . Not on file  Social History  Narrative   Right handed   Lives with wife   Social Determinants of Health   Financial Resource Strain:   . Difficulty of Paying Living Expenses:   Food Insecurity:   . Worried About Charity fundraiser in the Last Year:   . Arboriculturist in the Last Year:   Transportation Needs:   . Film/video editor (Medical):   Marland Kitchen Lack of Transportation (Non-Medical):   Physical Activity:   . Days of Exercise per Week:   . Minutes of Exercise per Session:   Stress:   . Feeling of Stress :   Social Connections:   . Frequency of Communication with Friends and Family:   . Frequency of Social Gatherings with Friends and Family:   . Attends Religious Services:   . Active Member of Clubs or Organizations:   . Attends Archivist Meetings:   Marland Kitchen Marital Status:   Intimate Partner Violence:   . Fear of Current or Ex-Partner:   . Emotionally Abused:   Marland Kitchen Physically Abused:   . Sexually Abused:     REVIEW OF SYSTEMS: Constitutional: No fevers, chills, or sweats, no generalized fatigue, change in appetite Eyes: No visual changes, double vision, eye pain Ear, nose and throat: No hearing loss, ear pain, nasal congestion, sore throat Cardiovascular: No chest pain, palpitations Respiratory:  No shortness of breath at rest or with exertion, wheezes GastrointestinaI: No nausea, vomiting, diarrhea, abdominal pain, fecal incontinence Genitourinary:  No dysuria, urinary retention or frequency Musculoskeletal:  No neck pain, back pain Integumentary: No rash, pruritus, skin lesions Neurological: as above Psychiatric: No depression, insomnia, anxiety Endocrine: No palpitations, fatigue, diaphoresis, mood swings, change in appetite, change in weight, increased thirst Hematologic/Lymphatic:  No anemia, purpura, petechiae. Allergic/Immunologic: no itchy/runny eyes, nasal congestion, recent allergic reactions, rashes  PHYSICAL EXAM: Vitals:   03/25/20 1027  BP: 138/82  Pulse: 61  SpO2: 99%     General: No acute distress Head:  Normocephalic/atraumatic Skin/Extremities: No rash, no edema Neurological Exam: Mental status: alert and oriented to person, place, and time, no dysarthria or aphasia, Fund of knowledge is appropriate.  Recent and remote memory are intact.  Attention and concentration are normal.   Cranial nerves: CN I: not tested CN II: pupils equal, round and reactive to light, visual fields intact CN III, IV, VI:  full range of motion, no nystagmus, no ptosis CN V: facial sensation intact CN VII: upper and lower face symmetric CN VIII: hearing intact to conversation Bulk & Tone: normal, no fasciculations. Motor: 5/5 throughout with no pronator drift. Sensation: intact to light touch, cold, pin, vibration and joint position sense.  No extinction to double simultaneous stimulation.  Romberg test negative Deep Tendon Reflexes: +2 throughout, no ankle clonus Plantar responses: downgoing bilaterally Cerebellar: no incoordination on finger to nose testing Gait: narrow-based and steady, able to tandem walk adequately. Tremor: none  IMPRESSION: This is a 69 year old right-handed man with a history of recurrent syncope. He had a syncopal episode in 2015 after colonoscopy with note of HR in the 50s. He has had 2 episodes of syncope soon after receiving distressing information, most recently last 02/20/20 with associated urinary incontinence. His neurological exam is normal. Head  CT unremarkable. Symptoms suggestive of vasovagal syncope, less likely seizure. We will do an EEG for completion. If normal, follow-up prn. He knows to call for any changes.   Thank you for allowing me to participate in the care of this patient. Please do not hesitate to call for any questions or concerns.   Ellouise Newer, M.D.  CC: Dr. Darl Householder, Dr. Doyle Askew

## 2020-03-25 NOTE — Patient Instructions (Signed)
Good to meet you. We will schedule the EEG and call with results. If normal, follow-up as needed. Call for any changes.

## 2020-03-31 ENCOUNTER — Other Ambulatory Visit: Payer: Self-pay

## 2020-03-31 ENCOUNTER — Ambulatory Visit (INDEPENDENT_AMBULATORY_CARE_PROVIDER_SITE_OTHER): Payer: Medicare Other | Admitting: Neurology

## 2020-03-31 DIAGNOSIS — R55 Syncope and collapse: Secondary | ICD-10-CM

## 2020-04-01 NOTE — Procedures (Signed)
ELECTROENCEPHALOGRAM REPORT  Date of Study: 03/31/2020  Patient's Name: Reginald Bauer MRN: [id number] Date of Birth: 05-Dec-1951  Referring Provider: Dr. Ellouise Newer  Clinical History: This is a 69 year old man with an episode of loss of consciousness with urinary incontinence. He had 2 prior syncopal episodes in the past 10 years.   Medications: Synthroid  Technical Summary: A multichannel digital EEG recording measured by the international 10-20 system with electrodes applied with paste and impedances below 5000 ohms performed in our laboratory with EKG monitoring in an awake and drowsy patient.  Hyperventilation was not performed. Photic stimulation was performed.  The digital EEG was referentially recorded, reformatted, and digitally filtered in a variety of bipolar and referential montages for optimal display.    Description: The patient is awake and drowsy during the recording.  During maximal wakefulness, there is a symmetric, medium voltage 10 Hz posterior dominant rhythm that attenuates with eye opening.  The record is symmetric.  During drowsiness, there is an increase in theta slowing of the background, at times sharply contoured over the bilateral temporal regions, left greater than right, without clear epileptogenic potential. Sleep was not captured. Photic stimulation did not elicit any abnormalities.  There were no epileptiform discharges or electrographic seizures seen.    EKG lead was unremarkable.  Impression: This awake and asleep EEG is normal.    Clinical Correlation: A normal EEG does not exclude a clinical diagnosis of epilepsy.  If further clinical questions remain, prolonged EEG may be helpful.  Clinical correlation is advised.   Ellouise Newer, M.D.

## 2020-04-02 ENCOUNTER — Telehealth: Payer: Self-pay

## 2020-04-02 NOTE — Telephone Encounter (Signed)
-----   Message from Cameron Sprang, MD sent at 04/01/2020  4:16 PM EDT ----- Pls let him know the brain wave test was normal, thanks

## 2020-04-02 NOTE — Telephone Encounter (Signed)
Pt called and informed the brain wave test was normal

## 2020-04-08 DIAGNOSIS — F4325 Adjustment disorder with mixed disturbance of emotions and conduct: Secondary | ICD-10-CM | POA: Diagnosis not present

## 2020-07-02 DIAGNOSIS — N400 Enlarged prostate without lower urinary tract symptoms: Secondary | ICD-10-CM | POA: Diagnosis not present

## 2020-07-02 DIAGNOSIS — E039 Hypothyroidism, unspecified: Secondary | ICD-10-CM | POA: Diagnosis not present

## 2020-07-02 DIAGNOSIS — Z131 Encounter for screening for diabetes mellitus: Secondary | ICD-10-CM | POA: Diagnosis not present

## 2020-07-02 DIAGNOSIS — Z125 Encounter for screening for malignant neoplasm of prostate: Secondary | ICD-10-CM | POA: Diagnosis not present

## 2020-07-02 DIAGNOSIS — Z136 Encounter for screening for cardiovascular disorders: Secondary | ICD-10-CM | POA: Diagnosis not present

## 2020-07-02 DIAGNOSIS — Z0000 Encounter for general adult medical examination without abnormal findings: Secondary | ICD-10-CM | POA: Diagnosis not present

## 2020-08-21 DIAGNOSIS — Z23 Encounter for immunization: Secondary | ICD-10-CM | POA: Diagnosis not present

## 2020-08-22 ENCOUNTER — Ambulatory Visit (INDEPENDENT_AMBULATORY_CARE_PROVIDER_SITE_OTHER): Payer: Medicare Other | Admitting: Cardiology

## 2020-08-22 ENCOUNTER — Other Ambulatory Visit: Payer: Self-pay

## 2020-08-22 ENCOUNTER — Encounter: Payer: Self-pay | Admitting: Cardiology

## 2020-08-22 VITALS — BP 121/72 | HR 75 | Temp 97.1°F | Ht 73.0 in | Wt 205.0 lb

## 2020-08-22 DIAGNOSIS — Z7189 Other specified counseling: Secondary | ICD-10-CM

## 2020-08-22 DIAGNOSIS — Z01818 Encounter for other preprocedural examination: Secondary | ICD-10-CM

## 2020-08-22 DIAGNOSIS — Z87898 Personal history of other specified conditions: Secondary | ICD-10-CM

## 2020-08-22 NOTE — Patient Instructions (Addendum)
Medication Instructions:  Your Physician recommend you continue on your current medication as directed.    *If you need a refill on your cardiac medications before your next appointment, please call your pharmacy*   Lab Work: None   Testing/Procedures: None   Follow-Up: At [facility], you and your health needs are our priority.  As part of our continuing mission to provide you with exceptional heart care, we have created designated Provider Care Teams.  These Care Teams include your primary Cardiologist (physician) and Advanced Practice Providers (APPs -  Physician Assistants and Nurse Practitioners) who all work together to provide you with the care you need, when you need it.  We recommend signing up for the patient portal called "MyChart".  Sign up information is provided on this After Visit Summary.  MyChart is used to connect with patients for Virtual Visits (Telemedicine).  Patients are able to view lab/test results, encounter notes, upcoming appointments, etc.  Non-urgent messages can be sent to your provider as well.   To learn more about what you can do with MyChart, go to [email or web address].    Your next appointment:   As needed  The format for your next appointment:   In Person  Provider:   Buford Dresser, MD   Other Instructions Acceptable risk for colonoscopy without further testing. Slow position changes, stay hydrated, especially after the procedure.

## 2020-08-22 NOTE — Progress Notes (Signed)
Cardiology Office Note:    Date:  08/22/2020   ID:  Reginald Bauer, DOB 1951/02/17, MRN [id number]  PCP:  Orpah Melter, MD  Cardiologist:  Buford Dresser, MD  Referring MD: Arta Silence, MD   CC: new patient evaluation for pre procedure cardiology clearance  History of Present Illness:    Reginald Bauer is a 69 y.o. male with a hx of syncope who is seen as a new consult at the request of Arta Silence, MD for the evaluation and management of clearance for colonoscopy. Per referral guidelines, concern is for recent syncope.  Note received and reviewed from Dr. Paulita Fujita dated 07/04/20. No mention of syncope in the note, but noted to be scheduled for colonoscopy on 08/29/20 for a history of colon polyps.  Reports that back in March, he got disturbing news and passed out. Felt lightheaded, put head down on his desk. Someone saw him and woke him up. No idea how long he was out, at most a few minutes. Back to normal after a few minutes. He went to his physician after and told everything was normal.  Had a prior syncopal episode after he had colonoscopy about 5 years ago. Had warning with lightheadedness just prior to loss of consciousness. No more than a minute or two. Again back to normal rapidly, better with elevating feet. Went to hospital, had evaluation and told everything was normal.  As colonoscopy is considered a low risk procedure, AHA guidelines recommend proceeding with procedure unless there is active cardiac disease.  Planned procedure: colonoscopy, date TBD  Pertinent past cardiac history: Prior cardiac workup: none History of valve disease: none History of CAD/PAD/CVA/TIA: none History of heart failure: none History of arrhythmia: none On anticoagulation: none History of hypertension: none History of diabetes: none History of CKD: none History of OSA: none History of anesthesia complications: none Current symptoms: Denies chest pain, shortness of breath at rest  or with normal exertion. No PND, orthopnea, LE edema or unexpected weight gain. No palpitations. Functional capacity: walks at school, climbs stairs without difficulty every day.  No past medical history on file.  Past Surgical History:  Procedure Laterality Date   COLONOSCOPY     annually    FRACTURE SURGERY     TONSILLECTOMY      Current Medications: Current Outpatient Medications on File Prior to Visit  Medication Sig   levothyroxine (SYNTHROID, LEVOTHROID) 125 MCG tablet Take 125 mcg by mouth daily before breakfast.   mupirocin ointment (BACTROBAN) 2 % SMARTSIG:1 Sparingly Topical Twice Daily   polyethylene glycol-electrolytes (NULYTELY) 420 g solution    tamsulosin (FLOMAX) 0.4 MG CAPS capsule    triamcinolone cream (KENALOG) 0.1 % Apply topically.   No current facility-administered medications on file prior to visit.     Allergies:   Clindamycin/lincomycin and Penicillins   Social History   Tobacco Use   Smoking status: Never Smoker   Smokeless tobacco: Never Used  Vaping Use   Vaping Use: Never used  Substance Use Topics   Alcohol use: No   Drug use: Never    Family History: family history is not on file.  ROS:   Please see the history of present illness.  Additional pertinent ROS: Constitutional: Negative for chills, fever, night sweats, unintentional weight loss  HENT: Negative for ear pain and hearing loss.   Eyes: Negative for loss of vision and eye pain.  Respiratory: Negative for cough, sputum, wheezing.   Cardiovascular: See HPI. Gastrointestinal: Negative for abdominal pain, melena,  and hematochezia.  Genitourinary: Negative for dysuria and hematuria.  Musculoskeletal: Negative for falls and myalgias.  Skin: Negative for itching and rash.  Neurological: Negative for focal weakness, focal sensory changes and loss of consciousness.  Endo/Heme/Allergies: Does not bruise/bleed easily.    No family history of heart  disease.   EKGs/Labs/Other Studies Reviewed:    The following studies were reviewed today: No prior cardiac studies.  EKG:  EKG is personally reviewed.  The ekg ordered today demonstrates NSR 65 bpm.  Recent Labs: 02/20/2020: BUN 17; Creatinine, Ser 1.01; Hemoglobin 13.2; Platelets 295; Potassium 3.9; Sodium 137  Recent Lipid Panel    Component Value Date/Time   CHOL (H) 11/21/2009 0335    203        ATP III CLASSIFICATION:  <200     mg/dL   Desirable  200-239  mg/dL   Borderline High  >=240    mg/dL   High          TRIG 160 (H) 11/21/2009 0335   HDL 38 (L) 11/21/2009 0335   CHOLHDL 5.3 11/21/2009 0335   VLDL 32 11/21/2009 0335   LDLCALC (H) 11/21/2009 0335    133        Total Cholesterol/HDL:CHD Risk Coronary Heart Disease Risk Table                     Men   Women  1/2 Average Risk   3.4   3.3  Average Risk       5.0   4.4  2 X Average Risk   9.6   7.1  3 X Average Risk  23.4   11.0        Use the calculated Patient Ratio above and the CHD Risk Table to determine the patient's CHD Risk.        ATP III CLASSIFICATION (LDL):  <100     mg/dL   Optimal  100-129  mg/dL   Near or Above                    Optimal  130-159  mg/dL   Borderline  160-189  mg/dL   High  >190     mg/dL   Very High    Physical Exam:    VS:  BP 121/72 (BP Location: Right Arm, Patient Position: Standing, Cuff Size: Large)    Pulse 75    Temp (!) 97.1 F (36.2 C)    Ht 6\' 1"  (1.854 m)    Wt 205 lb (93 kg)    SpO2 98%    BMI 27.05 kg/m     Wt Readings from Last 3 Encounters:  08/22/20 205 lb (93 kg)  03/25/20 207 lb 9.6 oz (94.2 kg)    Orthostatics: Lying 132/75, HR 65 Sitting 117/75, HR 73 Standing 121/72, HR 75  GEN: Well nourished, well developed in no acute distress HEENT: Normal, moist mucous membranes NECK: No JVD CARDIAC: regular rhythm, normal S1 and S2, no rubs or gallops. No murmurs. VASCULAR: Radial and DP pulses 2+ bilaterally. No carotid bruits RESPIRATORY:  Clear to  auscultation without rales, wheezing or rhonchi  ABDOMEN: Soft, non-tender, non-distended MUSCULOSKELETAL:  Ambulates independently SKIN: Warm and dry, no edema NEUROLOGIC:  Alert and oriented x 3. No focal neuro deficits noted. PSYCHIATRIC:  Normal affect    ASSESSMENT:    1. Pre-op evaluation   2. History of syncope   3. Cardiac risk counseling   4. Counseling on health promotion  and disease prevention    PLAN:    Based on available date, patient's RCRI score = 0, which has low 30-day risk of death, MI, or cardiac arrest.  The patient is not currently having active cardiac symptoms, and they can achieve >4 METs of activity.  According to ACC/AHA Guidelines, no further testing is needed.  Proceed with surgery at acceptable risk.  Our service is available as needed in the peri-operative period.     History of syncope He has a normal ECG, normal orthostatic vitals. The events that he has had sound most like reflex syncope. Recommend hydration, slow position changes around the time of the procedure.  Cardiac risk counseling and prevention recommendations: -recommend heart healthy/Mediterranean diet, with whole grains, fruits, vegetable, fish, lean meats, nuts, and olive oil. Limit salt. -recommend moderate walking, 3-5 times/week for 30-50 minutes each session. Aim for at least 150 minutes.week. Goal should be pace of 3 miles/hours, or walking 1.5 miles in 30 minutes -recommend avoidance of tobacco products. Avoid excess alcohol.  Plan for follow up: as needed  Buford Dresser, MD, PhD    [facility]    Medication Adjustments/Labs and Tests Ordered: Current medicines are reviewed at length with the patient today.  Concerns regarding medicines are outlined above.  Orders Placed This Encounter  Procedures   EKG 12-Lead   No orders of the defined types were placed in this encounter.   Patient Instructions  Medication Instructions:  Your Physician  recommend you continue on your current medication as directed.    *If you need a refill on your cardiac medications before your next appointment, please call your pharmacy*   Lab Work: None   Testing/Procedures: None   Follow-Up: At [facility], you and your health needs are our priority.  As part of our continuing mission to provide you with exceptional heart care, we have created designated Provider Care Teams.  These Care Teams include your primary Cardiologist (physician) and Advanced Practice Providers (APPs -  Physician Assistants and Nurse Practitioners) who all work together to provide you with the care you need, when you need it.  We recommend signing up for the patient portal called "MyChart".  Sign up information is provided on this After Visit Summary.  MyChart is used to connect with patients for Virtual Visits (Telemedicine).  Patients are able to view lab/test results, encounter notes, upcoming appointments, etc.  Non-urgent messages can be sent to your provider as well.   To learn more about what you can do with MyChart, go to [email or web address].    Your next appointment:   As needed  The format for your next appointment:   In Person  Provider:   Buford Dresser, MD   Other Instructions Acceptable risk for colonoscopy without further testing. Slow position changes, stay hydrated, especially after the procedure.   Signed, Buford Dresser, MD PhD 08/22/2020   [place]

## 2020-08-26 DIAGNOSIS — Z1159 Encounter for screening for other viral diseases: Secondary | ICD-10-CM | POA: Diagnosis not present

## 2020-08-29 DIAGNOSIS — Z86010 Personal history of colonic polyps: Secondary | ICD-10-CM | POA: Diagnosis not present

## 2020-08-29 DIAGNOSIS — D122 Benign neoplasm of ascending colon: Secondary | ICD-10-CM | POA: Diagnosis not present

## 2020-08-29 DIAGNOSIS — K5730 Diverticulosis of large intestine without perforation or abscess without bleeding: Secondary | ICD-10-CM | POA: Diagnosis not present

## 2020-09-02 DIAGNOSIS — D122 Benign neoplasm of ascending colon: Secondary | ICD-10-CM | POA: Diagnosis not present

## 2020-09-26 DIAGNOSIS — Z23 Encounter for immunization: Secondary | ICD-10-CM | POA: Diagnosis not present

## 2020-10-09 ENCOUNTER — Encounter: Payer: Self-pay | Admitting: Cardiology

## 2020-10-20 DIAGNOSIS — L821 Other seborrheic keratosis: Secondary | ICD-10-CM | POA: Diagnosis not present

## 2020-10-20 DIAGNOSIS — L570 Actinic keratosis: Secondary | ICD-10-CM | POA: Diagnosis not present

## 2020-10-20 DIAGNOSIS — Z85828 Personal history of other malignant neoplasm of skin: Secondary | ICD-10-CM | POA: Diagnosis not present

## 2020-10-20 DIAGNOSIS — L905 Scar conditions and fibrosis of skin: Secondary | ICD-10-CM | POA: Diagnosis not present

## 2020-10-27 DIAGNOSIS — Z1159 Encounter for screening for other viral diseases: Secondary | ICD-10-CM | POA: Diagnosis not present

## 2020-11-26 DIAGNOSIS — L570 Actinic keratosis: Secondary | ICD-10-CM | POA: Diagnosis not present

## 2020-11-28 DIAGNOSIS — M2041 Other hammer toe(s) (acquired), right foot: Secondary | ICD-10-CM | POA: Diagnosis not present

## 2020-11-28 DIAGNOSIS — M79671 Pain in right foot: Secondary | ICD-10-CM | POA: Diagnosis not present

## 2020-11-28 DIAGNOSIS — B353 Tinea pedis: Secondary | ICD-10-CM | POA: Diagnosis not present

## 2021-04-13 DIAGNOSIS — Z23 Encounter for immunization: Secondary | ICD-10-CM | POA: Diagnosis not present

## 2021-04-17 DIAGNOSIS — M79606 Pain in leg, unspecified: Secondary | ICD-10-CM | POA: Diagnosis not present

## 2021-07-07 DIAGNOSIS — E039 Hypothyroidism, unspecified: Secondary | ICD-10-CM | POA: Diagnosis not present

## 2021-07-07 DIAGNOSIS — Z0000 Encounter for general adult medical examination without abnormal findings: Secondary | ICD-10-CM | POA: Diagnosis not present

## 2021-07-07 DIAGNOSIS — Z131 Encounter for screening for diabetes mellitus: Secondary | ICD-10-CM | POA: Diagnosis not present

## 2021-07-07 DIAGNOSIS — Z136 Encounter for screening for cardiovascular disorders: Secondary | ICD-10-CM | POA: Diagnosis not present

## 2021-07-07 DIAGNOSIS — Z13220 Encounter for screening for lipoid disorders: Secondary | ICD-10-CM | POA: Diagnosis not present

## 2021-07-07 DIAGNOSIS — Z125 Encounter for screening for malignant neoplasm of prostate: Secondary | ICD-10-CM | POA: Diagnosis not present

## 2021-08-03 DIAGNOSIS — L237 Allergic contact dermatitis due to plants, except food: Secondary | ICD-10-CM | POA: Diagnosis not present

## 2021-08-17 IMAGING — CT CT HEAD W/O CM
3 series · 15 of 47 positions shown, 18 images · non-contrast
Comparison: Head CT dated 11/19/2009.

CLINICAL DATA: 69-year-old male with syncope. Concern for transient
ischemic attack.

EXAM:
CT HEAD WITHOUT CONTRAST
TECHNIQUE: Contiguous axial images were obtained from the base of the skull
through the vertex without intravenous contrast.

[Series 3: head 5.0 h30s · axial · 0.47mm/px · z∈[-134,+16]mm · 9 of 36 slices shown, 12 images]
[im 3/36  brain]
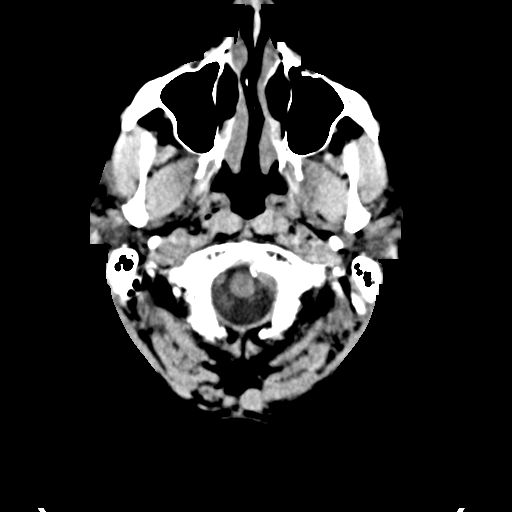
[im 3/36  bone]
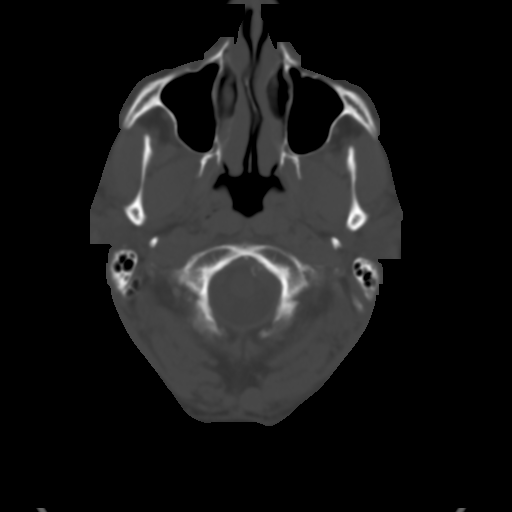
[im 7/36  brain]
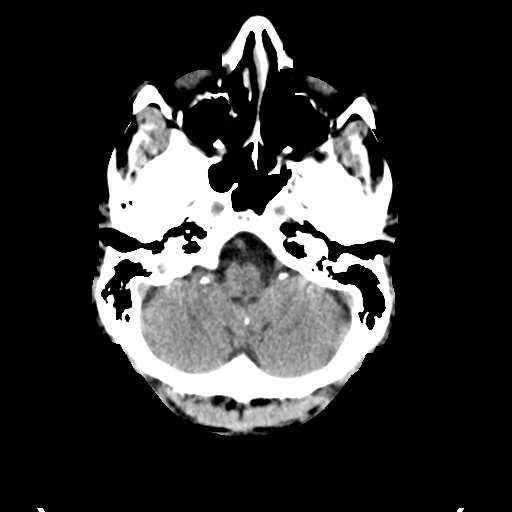
[im 10/36  brain]
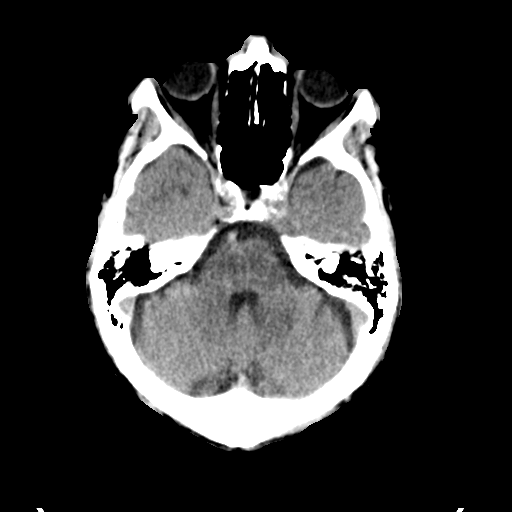
[im 14/36  brain]
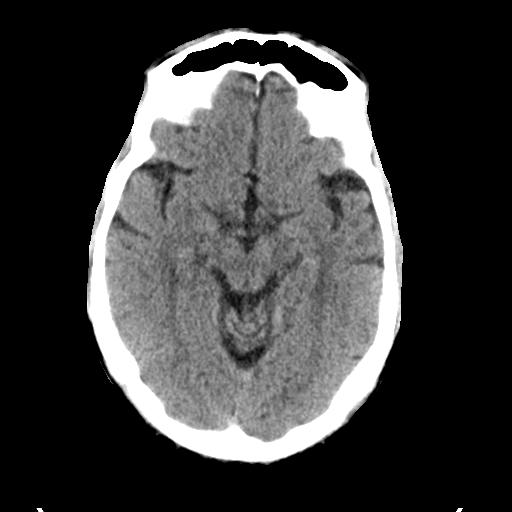
[im 19/36  brain]
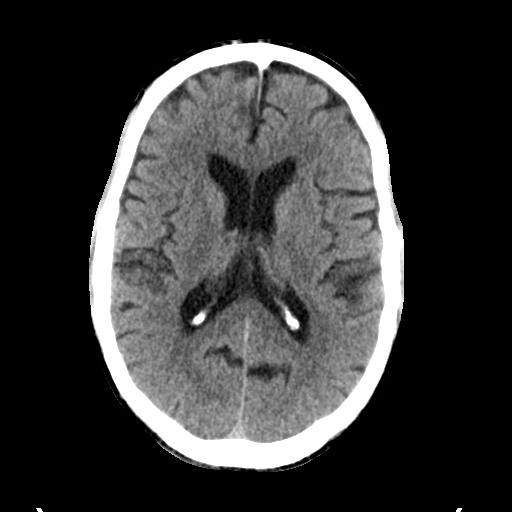
[im 19/36  bone]
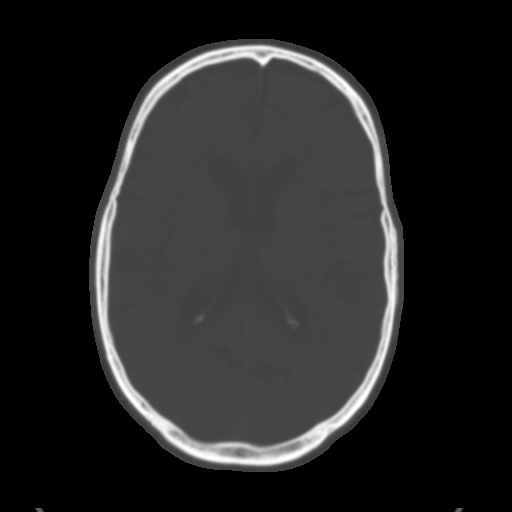
[im 22/36  brain]
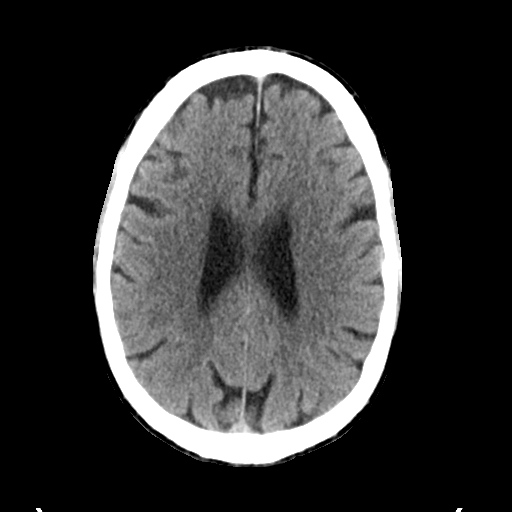
[im 26/36  brain]
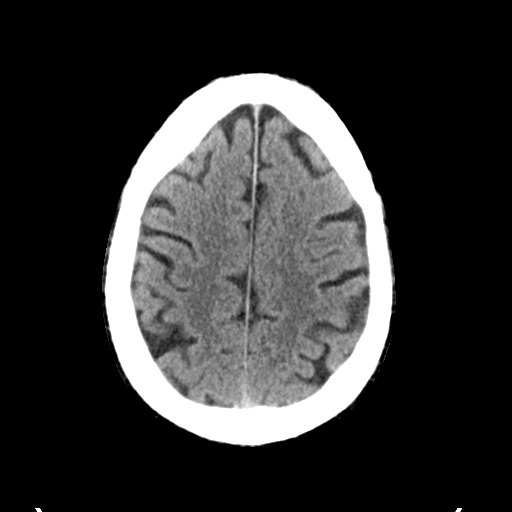
[im 29/36  brain]
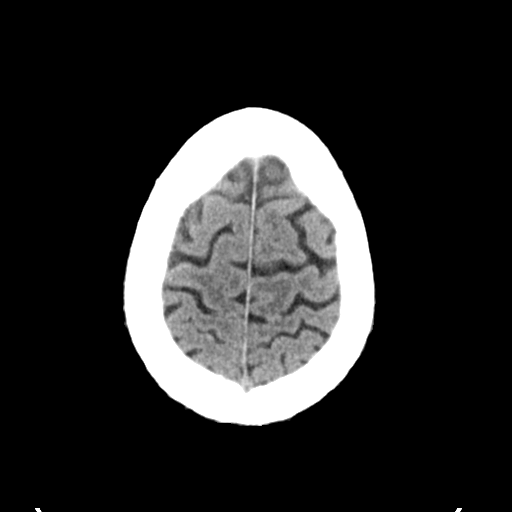
[im 33/36  brain]
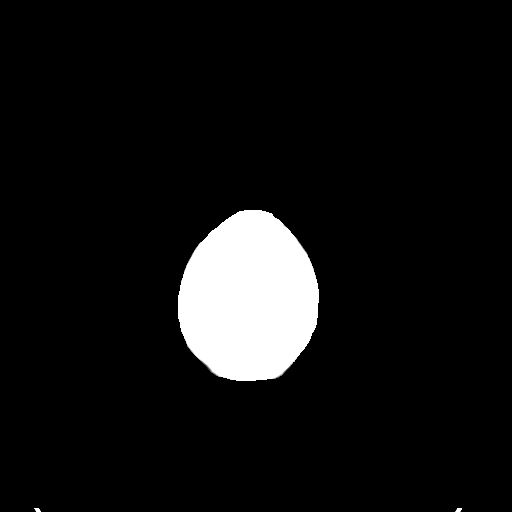
[im 33/36  bone]
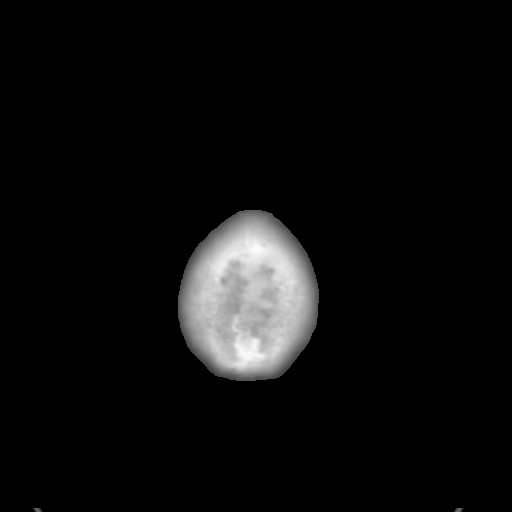

[Series 5: head 3.0 mpr cor · coronal · 0.35mm/px · 3 of 79 slices shown]
[im 27/79  brain]
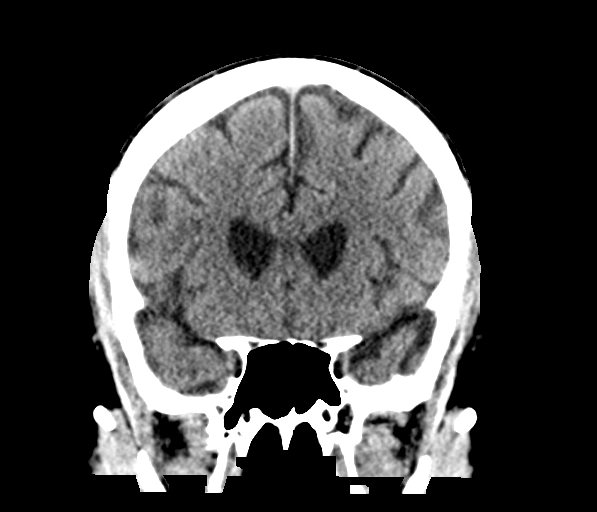
[im 35/79  brain]
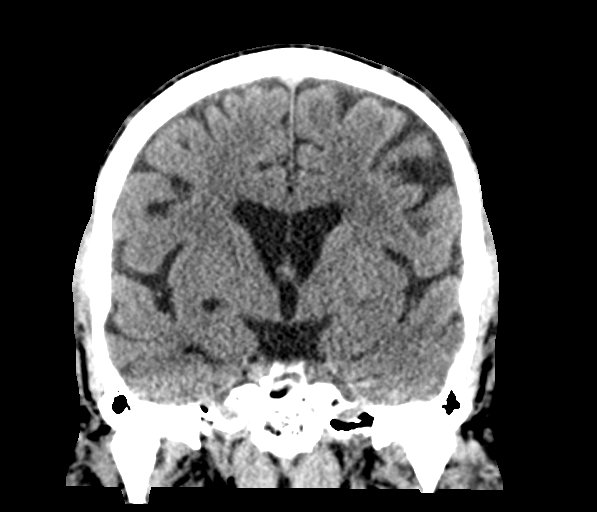
[im 44/79  brain]
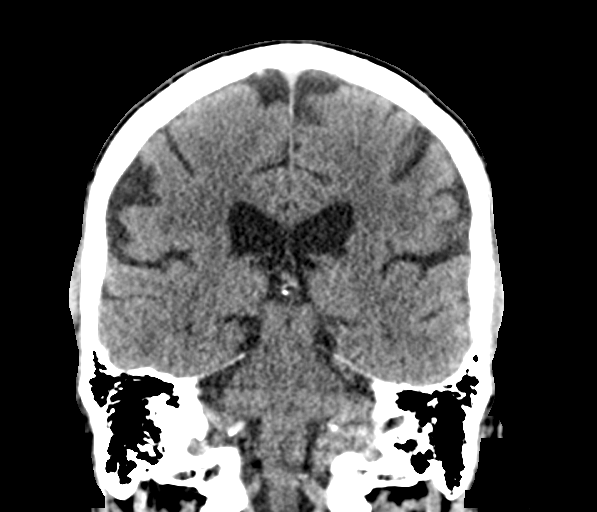

[Series 6: head 3.0 mpr sag · sagittal · 0.35mm/px · 3 of 64 slices shown]
[im 22/64  brain]
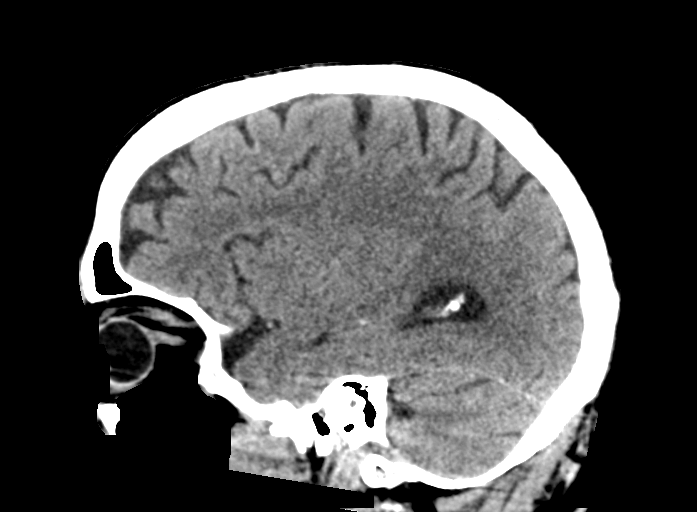
[im 32/64  brain]
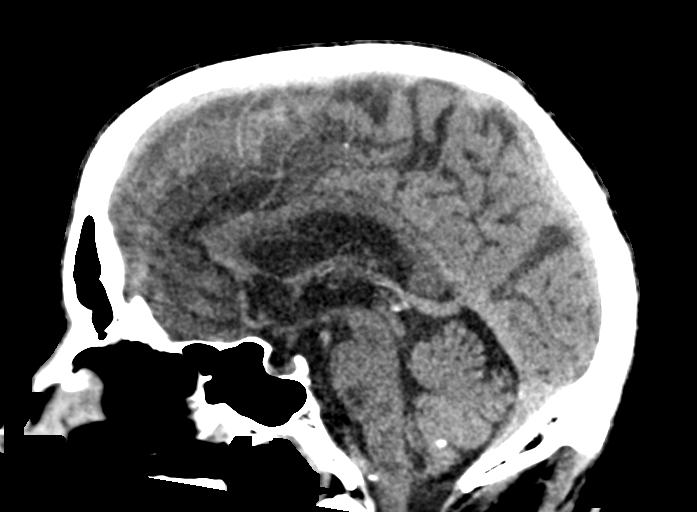
[im 43/64  brain]
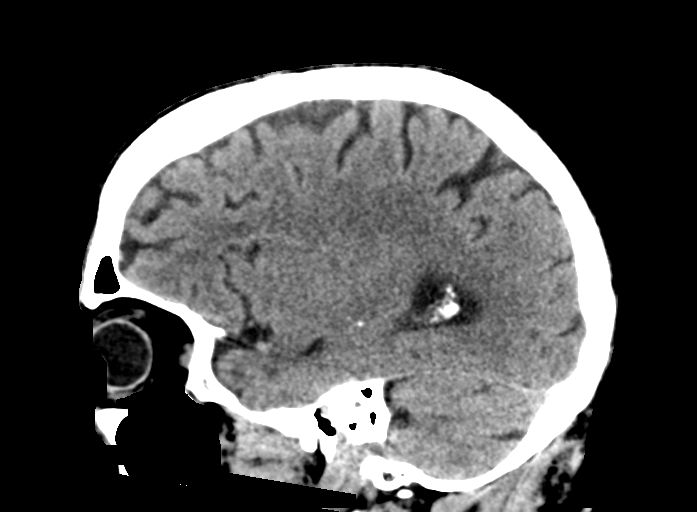

[15 of 47 positions shown; findings below may reference images not displayed]

FINDINGS: Brain: There is mild age-related atrophy and chronic microvascular
ischemic changes. Small old right lentiform nucleus lacunar infarct.
There is no acute intracranial hemorrhage. No mass effect or midline
shift no extra-axial fluid collection.

Vascular: No hyperdense vessel or unexpected calcification.

Skull: Normal. Negative for fracture or focal lesion.

Sinuses/Orbits: No acute finding.

Other: None
IMPRESSION: 1. No acute intracranial pathology.
2. Age-related atrophy and chronic microvascular ischemic changes.

## 2021-09-06 DIAGNOSIS — Z23 Encounter for immunization: Secondary | ICD-10-CM | POA: Diagnosis not present

## 2021-10-20 DIAGNOSIS — L821 Other seborrheic keratosis: Secondary | ICD-10-CM | POA: Diagnosis not present

## 2021-10-20 DIAGNOSIS — Z85828 Personal history of other malignant neoplasm of skin: Secondary | ICD-10-CM | POA: Diagnosis not present

## 2021-10-20 DIAGNOSIS — R229 Localized swelling, mass and lump, unspecified: Secondary | ICD-10-CM | POA: Diagnosis not present

## 2021-10-20 DIAGNOSIS — Z08 Encounter for follow-up examination after completed treatment for malignant neoplasm: Secondary | ICD-10-CM | POA: Diagnosis not present

## 2021-10-20 DIAGNOSIS — L570 Actinic keratosis: Secondary | ICD-10-CM | POA: Diagnosis not present

## 2021-10-20 DIAGNOSIS — C44612 Basal cell carcinoma of skin of right upper limb, including shoulder: Secondary | ICD-10-CM | POA: Diagnosis not present

## 2021-10-20 DIAGNOSIS — L578 Other skin changes due to chronic exposure to nonionizing radiation: Secondary | ICD-10-CM | POA: Diagnosis not present

## 2021-10-20 DIAGNOSIS — D485 Neoplasm of uncertain behavior of skin: Secondary | ICD-10-CM | POA: Diagnosis not present

## 2021-12-04 DIAGNOSIS — C44612 Basal cell carcinoma of skin of right upper limb, including shoulder: Secondary | ICD-10-CM | POA: Diagnosis not present

## 2021-12-04 DIAGNOSIS — L905 Scar conditions and fibrosis of skin: Secondary | ICD-10-CM | POA: Diagnosis not present

## 2021-12-07 DIAGNOSIS — M79606 Pain in leg, unspecified: Secondary | ICD-10-CM | POA: Diagnosis not present

## 2021-12-28 DIAGNOSIS — R058 Other specified cough: Secondary | ICD-10-CM | POA: Diagnosis not present

## 2022-01-12 DIAGNOSIS — M79651 Pain in right thigh: Secondary | ICD-10-CM | POA: Diagnosis not present

## 2022-01-12 DIAGNOSIS — R262 Difficulty in walking, not elsewhere classified: Secondary | ICD-10-CM | POA: Diagnosis not present

## 2022-01-12 DIAGNOSIS — R531 Weakness: Secondary | ICD-10-CM | POA: Diagnosis not present

## 2022-01-14 DIAGNOSIS — M79651 Pain in right thigh: Secondary | ICD-10-CM | POA: Diagnosis not present

## 2022-01-14 DIAGNOSIS — R262 Difficulty in walking, not elsewhere classified: Secondary | ICD-10-CM | POA: Diagnosis not present

## 2022-01-14 DIAGNOSIS — R531 Weakness: Secondary | ICD-10-CM | POA: Diagnosis not present

## 2022-01-19 DIAGNOSIS — M79651 Pain in right thigh: Secondary | ICD-10-CM | POA: Diagnosis not present

## 2022-01-19 DIAGNOSIS — R262 Difficulty in walking, not elsewhere classified: Secondary | ICD-10-CM | POA: Diagnosis not present

## 2022-01-19 DIAGNOSIS — R531 Weakness: Secondary | ICD-10-CM | POA: Diagnosis not present

## 2022-01-21 DIAGNOSIS — R262 Difficulty in walking, not elsewhere classified: Secondary | ICD-10-CM | POA: Diagnosis not present

## 2022-01-21 DIAGNOSIS — R531 Weakness: Secondary | ICD-10-CM | POA: Diagnosis not present

## 2022-01-21 DIAGNOSIS — M79651 Pain in right thigh: Secondary | ICD-10-CM | POA: Diagnosis not present

## 2022-01-26 DIAGNOSIS — R262 Difficulty in walking, not elsewhere classified: Secondary | ICD-10-CM | POA: Diagnosis not present

## 2022-01-26 DIAGNOSIS — R531 Weakness: Secondary | ICD-10-CM | POA: Diagnosis not present

## 2022-01-26 DIAGNOSIS — M79651 Pain in right thigh: Secondary | ICD-10-CM | POA: Diagnosis not present

## 2022-01-28 DIAGNOSIS — R531 Weakness: Secondary | ICD-10-CM | POA: Diagnosis not present

## 2022-01-28 DIAGNOSIS — R262 Difficulty in walking, not elsewhere classified: Secondary | ICD-10-CM | POA: Diagnosis not present

## 2022-01-28 DIAGNOSIS — M79651 Pain in right thigh: Secondary | ICD-10-CM | POA: Diagnosis not present

## 2022-02-02 DIAGNOSIS — R531 Weakness: Secondary | ICD-10-CM | POA: Diagnosis not present

## 2022-02-02 DIAGNOSIS — R262 Difficulty in walking, not elsewhere classified: Secondary | ICD-10-CM | POA: Diagnosis not present

## 2022-02-02 DIAGNOSIS — M79651 Pain in right thigh: Secondary | ICD-10-CM | POA: Diagnosis not present

## 2022-02-03 DIAGNOSIS — M545 Low back pain, unspecified: Secondary | ICD-10-CM | POA: Diagnosis not present

## 2022-02-11 DIAGNOSIS — R262 Difficulty in walking, not elsewhere classified: Secondary | ICD-10-CM | POA: Diagnosis not present

## 2022-02-11 DIAGNOSIS — R531 Weakness: Secondary | ICD-10-CM | POA: Diagnosis not present

## 2022-02-11 DIAGNOSIS — M79651 Pain in right thigh: Secondary | ICD-10-CM | POA: Diagnosis not present

## 2022-02-12 DIAGNOSIS — M545 Low back pain, unspecified: Secondary | ICD-10-CM | POA: Diagnosis not present

## 2022-02-17 DIAGNOSIS — M545 Low back pain, unspecified: Secondary | ICD-10-CM | POA: Diagnosis not present

## 2022-02-23 DIAGNOSIS — M5416 Radiculopathy, lumbar region: Secondary | ICD-10-CM | POA: Diagnosis not present

## 2022-02-26 DIAGNOSIS — R531 Weakness: Secondary | ICD-10-CM | POA: Diagnosis not present

## 2022-02-26 DIAGNOSIS — R262 Difficulty in walking, not elsewhere classified: Secondary | ICD-10-CM | POA: Diagnosis not present

## 2022-02-26 DIAGNOSIS — M79651 Pain in right thigh: Secondary | ICD-10-CM | POA: Diagnosis not present

## 2022-03-02 DIAGNOSIS — R262 Difficulty in walking, not elsewhere classified: Secondary | ICD-10-CM | POA: Diagnosis not present

## 2022-03-02 DIAGNOSIS — R531 Weakness: Secondary | ICD-10-CM | POA: Diagnosis not present

## 2022-03-02 DIAGNOSIS — M79651 Pain in right thigh: Secondary | ICD-10-CM | POA: Diagnosis not present

## 2022-03-09 DIAGNOSIS — R262 Difficulty in walking, not elsewhere classified: Secondary | ICD-10-CM | POA: Diagnosis not present

## 2022-03-09 DIAGNOSIS — M79651 Pain in right thigh: Secondary | ICD-10-CM | POA: Diagnosis not present

## 2022-03-09 DIAGNOSIS — R531 Weakness: Secondary | ICD-10-CM | POA: Diagnosis not present

## 2022-03-10 DIAGNOSIS — M5416 Radiculopathy, lumbar region: Secondary | ICD-10-CM | POA: Diagnosis not present

## 2022-03-12 DIAGNOSIS — M79651 Pain in right thigh: Secondary | ICD-10-CM | POA: Diagnosis not present

## 2022-03-12 DIAGNOSIS — R262 Difficulty in walking, not elsewhere classified: Secondary | ICD-10-CM | POA: Diagnosis not present

## 2022-03-12 DIAGNOSIS — R531 Weakness: Secondary | ICD-10-CM | POA: Diagnosis not present

## 2022-03-18 DIAGNOSIS — R262 Difficulty in walking, not elsewhere classified: Secondary | ICD-10-CM | POA: Diagnosis not present

## 2022-03-18 DIAGNOSIS — M79651 Pain in right thigh: Secondary | ICD-10-CM | POA: Diagnosis not present

## 2022-03-18 DIAGNOSIS — R531 Weakness: Secondary | ICD-10-CM | POA: Diagnosis not present

## 2022-03-22 DIAGNOSIS — M79651 Pain in right thigh: Secondary | ICD-10-CM | POA: Diagnosis not present

## 2022-03-22 DIAGNOSIS — R262 Difficulty in walking, not elsewhere classified: Secondary | ICD-10-CM | POA: Diagnosis not present

## 2022-03-22 DIAGNOSIS — R531 Weakness: Secondary | ICD-10-CM | POA: Diagnosis not present

## 2022-03-23 DIAGNOSIS — M5416 Radiculopathy, lumbar region: Secondary | ICD-10-CM | POA: Diagnosis not present

## 2022-03-25 DIAGNOSIS — R262 Difficulty in walking, not elsewhere classified: Secondary | ICD-10-CM | POA: Diagnosis not present

## 2022-03-25 DIAGNOSIS — R531 Weakness: Secondary | ICD-10-CM | POA: Diagnosis not present

## 2022-03-25 DIAGNOSIS — M79651 Pain in right thigh: Secondary | ICD-10-CM | POA: Diagnosis not present

## 2022-03-30 DIAGNOSIS — R262 Difficulty in walking, not elsewhere classified: Secondary | ICD-10-CM | POA: Diagnosis not present

## 2022-03-30 DIAGNOSIS — R531 Weakness: Secondary | ICD-10-CM | POA: Diagnosis not present

## 2022-03-30 DIAGNOSIS — M79651 Pain in right thigh: Secondary | ICD-10-CM | POA: Diagnosis not present

## 2022-04-01 DIAGNOSIS — M79651 Pain in right thigh: Secondary | ICD-10-CM | POA: Diagnosis not present

## 2022-04-01 DIAGNOSIS — R262 Difficulty in walking, not elsewhere classified: Secondary | ICD-10-CM | POA: Diagnosis not present

## 2022-04-01 DIAGNOSIS — R531 Weakness: Secondary | ICD-10-CM | POA: Diagnosis not present

## 2022-04-06 DIAGNOSIS — R262 Difficulty in walking, not elsewhere classified: Secondary | ICD-10-CM | POA: Diagnosis not present

## 2022-04-06 DIAGNOSIS — M79651 Pain in right thigh: Secondary | ICD-10-CM | POA: Diagnosis not present

## 2022-04-06 DIAGNOSIS — R531 Weakness: Secondary | ICD-10-CM | POA: Diagnosis not present

## 2022-04-08 DIAGNOSIS — R262 Difficulty in walking, not elsewhere classified: Secondary | ICD-10-CM | POA: Diagnosis not present

## 2022-04-08 DIAGNOSIS — M79651 Pain in right thigh: Secondary | ICD-10-CM | POA: Diagnosis not present

## 2022-04-08 DIAGNOSIS — R531 Weakness: Secondary | ICD-10-CM | POA: Diagnosis not present

## 2022-04-12 DIAGNOSIS — M25551 Pain in right hip: Secondary | ICD-10-CM | POA: Diagnosis not present

## 2022-04-13 DIAGNOSIS — R262 Difficulty in walking, not elsewhere classified: Secondary | ICD-10-CM | POA: Diagnosis not present

## 2022-04-13 DIAGNOSIS — R531 Weakness: Secondary | ICD-10-CM | POA: Diagnosis not present

## 2022-04-13 DIAGNOSIS — M79651 Pain in right thigh: Secondary | ICD-10-CM | POA: Diagnosis not present

## 2022-04-15 DIAGNOSIS — M79651 Pain in right thigh: Secondary | ICD-10-CM | POA: Diagnosis not present

## 2022-04-15 DIAGNOSIS — R531 Weakness: Secondary | ICD-10-CM | POA: Diagnosis not present

## 2022-04-15 DIAGNOSIS — R262 Difficulty in walking, not elsewhere classified: Secondary | ICD-10-CM | POA: Diagnosis not present

## 2022-04-20 DIAGNOSIS — R262 Difficulty in walking, not elsewhere classified: Secondary | ICD-10-CM | POA: Diagnosis not present

## 2022-04-20 DIAGNOSIS — M79651 Pain in right thigh: Secondary | ICD-10-CM | POA: Diagnosis not present

## 2022-04-20 DIAGNOSIS — R531 Weakness: Secondary | ICD-10-CM | POA: Diagnosis not present

## 2022-04-22 DIAGNOSIS — M79651 Pain in right thigh: Secondary | ICD-10-CM | POA: Diagnosis not present

## 2022-04-22 DIAGNOSIS — R531 Weakness: Secondary | ICD-10-CM | POA: Diagnosis not present

## 2022-04-22 DIAGNOSIS — R262 Difficulty in walking, not elsewhere classified: Secondary | ICD-10-CM | POA: Diagnosis not present

## 2022-04-23 DIAGNOSIS — M25551 Pain in right hip: Secondary | ICD-10-CM | POA: Diagnosis not present

## 2022-04-26 DIAGNOSIS — R262 Difficulty in walking, not elsewhere classified: Secondary | ICD-10-CM | POA: Diagnosis not present

## 2022-04-26 DIAGNOSIS — M79651 Pain in right thigh: Secondary | ICD-10-CM | POA: Diagnosis not present

## 2022-04-26 DIAGNOSIS — R531 Weakness: Secondary | ICD-10-CM | POA: Diagnosis not present

## 2022-04-30 DIAGNOSIS — R531 Weakness: Secondary | ICD-10-CM | POA: Diagnosis not present

## 2022-04-30 DIAGNOSIS — R262 Difficulty in walking, not elsewhere classified: Secondary | ICD-10-CM | POA: Diagnosis not present

## 2022-04-30 DIAGNOSIS — M79651 Pain in right thigh: Secondary | ICD-10-CM | POA: Diagnosis not present

## 2022-05-03 DIAGNOSIS — R531 Weakness: Secondary | ICD-10-CM | POA: Diagnosis not present

## 2022-05-03 DIAGNOSIS — M79651 Pain in right thigh: Secondary | ICD-10-CM | POA: Diagnosis not present

## 2022-05-03 DIAGNOSIS — R262 Difficulty in walking, not elsewhere classified: Secondary | ICD-10-CM | POA: Diagnosis not present

## 2022-05-06 DIAGNOSIS — M79651 Pain in right thigh: Secondary | ICD-10-CM | POA: Diagnosis not present

## 2022-05-06 DIAGNOSIS — R262 Difficulty in walking, not elsewhere classified: Secondary | ICD-10-CM | POA: Diagnosis not present

## 2022-05-06 DIAGNOSIS — R531 Weakness: Secondary | ICD-10-CM | POA: Diagnosis not present

## 2022-05-10 DIAGNOSIS — M25551 Pain in right hip: Secondary | ICD-10-CM | POA: Diagnosis not present

## 2022-05-11 DIAGNOSIS — R531 Weakness: Secondary | ICD-10-CM | POA: Diagnosis not present

## 2022-05-11 DIAGNOSIS — R262 Difficulty in walking, not elsewhere classified: Secondary | ICD-10-CM | POA: Diagnosis not present

## 2022-05-11 DIAGNOSIS — M79651 Pain in right thigh: Secondary | ICD-10-CM | POA: Diagnosis not present

## 2022-05-13 DIAGNOSIS — M79651 Pain in right thigh: Secondary | ICD-10-CM | POA: Diagnosis not present

## 2022-05-13 DIAGNOSIS — R262 Difficulty in walking, not elsewhere classified: Secondary | ICD-10-CM | POA: Diagnosis not present

## 2022-05-13 DIAGNOSIS — R531 Weakness: Secondary | ICD-10-CM | POA: Diagnosis not present

## 2022-05-18 DIAGNOSIS — R262 Difficulty in walking, not elsewhere classified: Secondary | ICD-10-CM | POA: Diagnosis not present

## 2022-05-18 DIAGNOSIS — R531 Weakness: Secondary | ICD-10-CM | POA: Diagnosis not present

## 2022-05-18 DIAGNOSIS — M79651 Pain in right thigh: Secondary | ICD-10-CM | POA: Diagnosis not present

## 2022-05-20 DIAGNOSIS — M79651 Pain in right thigh: Secondary | ICD-10-CM | POA: Diagnosis not present

## 2022-05-20 DIAGNOSIS — R531 Weakness: Secondary | ICD-10-CM | POA: Diagnosis not present

## 2022-05-20 DIAGNOSIS — R262 Difficulty in walking, not elsewhere classified: Secondary | ICD-10-CM | POA: Diagnosis not present

## 2022-05-24 DIAGNOSIS — R531 Weakness: Secondary | ICD-10-CM | POA: Diagnosis not present

## 2022-05-24 DIAGNOSIS — R262 Difficulty in walking, not elsewhere classified: Secondary | ICD-10-CM | POA: Diagnosis not present

## 2022-05-24 DIAGNOSIS — M79651 Pain in right thigh: Secondary | ICD-10-CM | POA: Diagnosis not present

## 2022-05-28 DIAGNOSIS — M79651 Pain in right thigh: Secondary | ICD-10-CM | POA: Diagnosis not present

## 2022-05-28 DIAGNOSIS — R531 Weakness: Secondary | ICD-10-CM | POA: Diagnosis not present

## 2022-05-28 DIAGNOSIS — R262 Difficulty in walking, not elsewhere classified: Secondary | ICD-10-CM | POA: Diagnosis not present

## 2022-06-03 DIAGNOSIS — M25551 Pain in right hip: Secondary | ICD-10-CM | POA: Diagnosis not present

## 2022-06-08 DIAGNOSIS — M25561 Pain in right knee: Secondary | ICD-10-CM | POA: Diagnosis not present

## 2022-06-08 DIAGNOSIS — M25551 Pain in right hip: Secondary | ICD-10-CM | POA: Diagnosis not present

## 2022-07-08 DIAGNOSIS — N400 Enlarged prostate without lower urinary tract symptoms: Secondary | ICD-10-CM | POA: Diagnosis not present

## 2022-07-08 DIAGNOSIS — Z0000 Encounter for general adult medical examination without abnormal findings: Secondary | ICD-10-CM | POA: Diagnosis not present

## 2022-07-08 DIAGNOSIS — M542 Cervicalgia: Secondary | ICD-10-CM | POA: Diagnosis not present

## 2022-07-08 DIAGNOSIS — E039 Hypothyroidism, unspecified: Secondary | ICD-10-CM | POA: Diagnosis not present

## 2022-07-08 DIAGNOSIS — Z125 Encounter for screening for malignant neoplasm of prostate: Secondary | ICD-10-CM | POA: Diagnosis not present

## 2022-07-13 DIAGNOSIS — M542 Cervicalgia: Secondary | ICD-10-CM | POA: Diagnosis not present

## 2022-08-19 DIAGNOSIS — H903 Sensorineural hearing loss, bilateral: Secondary | ICD-10-CM | POA: Diagnosis not present

## 2022-08-19 DIAGNOSIS — E039 Hypothyroidism, unspecified: Secondary | ICD-10-CM | POA: Diagnosis not present

## 2022-09-10 DIAGNOSIS — E039 Hypothyroidism, unspecified: Secondary | ICD-10-CM | POA: Diagnosis not present

## 2022-09-10 DIAGNOSIS — R634 Abnormal weight loss: Secondary | ICD-10-CM | POA: Diagnosis not present

## 2022-09-29 DIAGNOSIS — Z23 Encounter for immunization: Secondary | ICD-10-CM | POA: Diagnosis not present

## 2022-10-12 DIAGNOSIS — E039 Hypothyroidism, unspecified: Secondary | ICD-10-CM | POA: Diagnosis not present

## 2022-10-15 DIAGNOSIS — M25551 Pain in right hip: Secondary | ICD-10-CM | POA: Diagnosis not present

## 2022-11-02 DIAGNOSIS — R413 Other amnesia: Secondary | ICD-10-CM | POA: Diagnosis not present

## 2022-11-09 DIAGNOSIS — C44329 Squamous cell carcinoma of skin of other parts of face: Secondary | ICD-10-CM | POA: Diagnosis not present

## 2022-11-09 DIAGNOSIS — L300 Nummular dermatitis: Secondary | ICD-10-CM | POA: Diagnosis not present

## 2022-11-09 DIAGNOSIS — D225 Melanocytic nevi of trunk: Secondary | ICD-10-CM | POA: Diagnosis not present

## 2022-11-09 DIAGNOSIS — L570 Actinic keratosis: Secondary | ICD-10-CM | POA: Diagnosis not present

## 2022-11-09 DIAGNOSIS — Z08 Encounter for follow-up examination after completed treatment for malignant neoplasm: Secondary | ICD-10-CM | POA: Diagnosis not present

## 2022-11-09 DIAGNOSIS — Z85828 Personal history of other malignant neoplasm of skin: Secondary | ICD-10-CM | POA: Diagnosis not present

## 2022-11-09 DIAGNOSIS — L821 Other seborrheic keratosis: Secondary | ICD-10-CM | POA: Diagnosis not present

## 2022-11-09 DIAGNOSIS — D485 Neoplasm of uncertain behavior of skin: Secondary | ICD-10-CM | POA: Diagnosis not present

## 2022-11-09 DIAGNOSIS — R229 Localized swelling, mass and lump, unspecified: Secondary | ICD-10-CM | POA: Diagnosis not present

## 2022-11-09 DIAGNOSIS — L578 Other skin changes due to chronic exposure to nonionizing radiation: Secondary | ICD-10-CM | POA: Diagnosis not present

## 2022-12-23 DIAGNOSIS — D0439 Carcinoma in situ of skin of other parts of face: Secondary | ICD-10-CM | POA: Diagnosis not present

## 2022-12-24 DIAGNOSIS — M25562 Pain in left knee: Secondary | ICD-10-CM | POA: Diagnosis not present

## 2022-12-24 DIAGNOSIS — M25561 Pain in right knee: Secondary | ICD-10-CM | POA: Diagnosis not present

## 2022-12-24 DIAGNOSIS — M25551 Pain in right hip: Secondary | ICD-10-CM | POA: Diagnosis not present

## 2023-01-26 DIAGNOSIS — M1712 Unilateral primary osteoarthritis, left knee: Secondary | ICD-10-CM | POA: Diagnosis not present

## 2023-01-26 DIAGNOSIS — M1611 Unilateral primary osteoarthritis, right hip: Secondary | ICD-10-CM | POA: Diagnosis not present

## 2023-02-04 DIAGNOSIS — M169 Osteoarthritis of hip, unspecified: Secondary | ICD-10-CM | POA: Diagnosis not present

## 2023-02-23 DIAGNOSIS — M25551 Pain in right hip: Secondary | ICD-10-CM | POA: Diagnosis not present

## 2023-05-09 DIAGNOSIS — M25551 Pain in right hip: Secondary | ICD-10-CM | POA: Diagnosis not present

## 2023-05-09 DIAGNOSIS — Z01812 Encounter for preprocedural laboratory examination: Secondary | ICD-10-CM | POA: Diagnosis not present

## 2023-05-09 DIAGNOSIS — M1611 Unilateral primary osteoarthritis, right hip: Secondary | ICD-10-CM | POA: Diagnosis not present

## 2023-06-02 DIAGNOSIS — M1611 Unilateral primary osteoarthritis, right hip: Secondary | ICD-10-CM | POA: Diagnosis not present

## 2023-06-06 DIAGNOSIS — R2689 Other abnormalities of gait and mobility: Secondary | ICD-10-CM | POA: Diagnosis not present

## 2023-06-06 DIAGNOSIS — M1611 Unilateral primary osteoarthritis, right hip: Secondary | ICD-10-CM | POA: Diagnosis not present

## 2023-06-09 DIAGNOSIS — M1611 Unilateral primary osteoarthritis, right hip: Secondary | ICD-10-CM | POA: Diagnosis not present

## 2023-06-09 DIAGNOSIS — R2689 Other abnormalities of gait and mobility: Secondary | ICD-10-CM | POA: Diagnosis not present

## 2023-06-14 DIAGNOSIS — M1611 Unilateral primary osteoarthritis, right hip: Secondary | ICD-10-CM | POA: Diagnosis not present

## 2023-06-14 DIAGNOSIS — R2689 Other abnormalities of gait and mobility: Secondary | ICD-10-CM | POA: Diagnosis not present

## 2023-06-15 DIAGNOSIS — M1611 Unilateral primary osteoarthritis, right hip: Secondary | ICD-10-CM | POA: Diagnosis not present

## 2023-06-17 DIAGNOSIS — M1611 Unilateral primary osteoarthritis, right hip: Secondary | ICD-10-CM | POA: Diagnosis not present

## 2023-06-17 DIAGNOSIS — R2689 Other abnormalities of gait and mobility: Secondary | ICD-10-CM | POA: Diagnosis not present

## 2023-06-20 DIAGNOSIS — R2689 Other abnormalities of gait and mobility: Secondary | ICD-10-CM | POA: Diagnosis not present

## 2023-06-20 DIAGNOSIS — M1611 Unilateral primary osteoarthritis, right hip: Secondary | ICD-10-CM | POA: Diagnosis not present

## 2023-06-22 DIAGNOSIS — R2689 Other abnormalities of gait and mobility: Secondary | ICD-10-CM | POA: Diagnosis not present

## 2023-06-22 DIAGNOSIS — M1611 Unilateral primary osteoarthritis, right hip: Secondary | ICD-10-CM | POA: Diagnosis not present

## 2023-06-29 DIAGNOSIS — M1611 Unilateral primary osteoarthritis, right hip: Secondary | ICD-10-CM | POA: Diagnosis not present

## 2023-06-29 DIAGNOSIS — R2689 Other abnormalities of gait and mobility: Secondary | ICD-10-CM | POA: Diagnosis not present

## 2023-07-01 DIAGNOSIS — M1611 Unilateral primary osteoarthritis, right hip: Secondary | ICD-10-CM | POA: Diagnosis not present

## 2023-07-01 DIAGNOSIS — R2689 Other abnormalities of gait and mobility: Secondary | ICD-10-CM | POA: Diagnosis not present

## 2023-07-04 DIAGNOSIS — R2689 Other abnormalities of gait and mobility: Secondary | ICD-10-CM | POA: Diagnosis not present

## 2023-07-04 DIAGNOSIS — M1611 Unilateral primary osteoarthritis, right hip: Secondary | ICD-10-CM | POA: Diagnosis not present

## 2023-07-07 DIAGNOSIS — M1611 Unilateral primary osteoarthritis, right hip: Secondary | ICD-10-CM | POA: Diagnosis not present

## 2023-07-07 DIAGNOSIS — R2689 Other abnormalities of gait and mobility: Secondary | ICD-10-CM | POA: Diagnosis not present

## 2023-07-12 DIAGNOSIS — R2689 Other abnormalities of gait and mobility: Secondary | ICD-10-CM | POA: Diagnosis not present

## 2023-07-12 DIAGNOSIS — M1611 Unilateral primary osteoarthritis, right hip: Secondary | ICD-10-CM | POA: Diagnosis not present

## 2023-07-13 DIAGNOSIS — Z0000 Encounter for general adult medical examination without abnormal findings: Secondary | ICD-10-CM | POA: Diagnosis not present

## 2023-07-13 DIAGNOSIS — N400 Enlarged prostate without lower urinary tract symptoms: Secondary | ICD-10-CM | POA: Diagnosis not present

## 2023-07-13 DIAGNOSIS — Z136 Encounter for screening for cardiovascular disorders: Secondary | ICD-10-CM | POA: Diagnosis not present

## 2023-07-13 DIAGNOSIS — E039 Hypothyroidism, unspecified: Secondary | ICD-10-CM | POA: Diagnosis not present

## 2023-07-13 DIAGNOSIS — Z13220 Encounter for screening for lipoid disorders: Secondary | ICD-10-CM | POA: Diagnosis not present

## 2023-07-14 DIAGNOSIS — M1611 Unilateral primary osteoarthritis, right hip: Secondary | ICD-10-CM | POA: Diagnosis not present

## 2023-07-14 DIAGNOSIS — R2689 Other abnormalities of gait and mobility: Secondary | ICD-10-CM | POA: Diagnosis not present

## 2023-07-15 DIAGNOSIS — M1611 Unilateral primary osteoarthritis, right hip: Secondary | ICD-10-CM | POA: Diagnosis not present

## 2023-07-18 DIAGNOSIS — R2689 Other abnormalities of gait and mobility: Secondary | ICD-10-CM | POA: Diagnosis not present

## 2023-07-18 DIAGNOSIS — M1611 Unilateral primary osteoarthritis, right hip: Secondary | ICD-10-CM | POA: Diagnosis not present

## 2023-07-21 DIAGNOSIS — R2689 Other abnormalities of gait and mobility: Secondary | ICD-10-CM | POA: Diagnosis not present

## 2023-07-21 DIAGNOSIS — M1611 Unilateral primary osteoarthritis, right hip: Secondary | ICD-10-CM | POA: Diagnosis not present

## 2023-07-26 DIAGNOSIS — M1611 Unilateral primary osteoarthritis, right hip: Secondary | ICD-10-CM | POA: Diagnosis not present

## 2023-07-26 DIAGNOSIS — R2689 Other abnormalities of gait and mobility: Secondary | ICD-10-CM | POA: Diagnosis not present

## 2023-07-28 DIAGNOSIS — R2689 Other abnormalities of gait and mobility: Secondary | ICD-10-CM | POA: Diagnosis not present

## 2023-07-28 DIAGNOSIS — M1611 Unilateral primary osteoarthritis, right hip: Secondary | ICD-10-CM | POA: Diagnosis not present

## 2023-08-02 DIAGNOSIS — R2689 Other abnormalities of gait and mobility: Secondary | ICD-10-CM | POA: Diagnosis not present

## 2023-08-02 DIAGNOSIS — M1611 Unilateral primary osteoarthritis, right hip: Secondary | ICD-10-CM | POA: Diagnosis not present

## 2023-08-04 DIAGNOSIS — M1611 Unilateral primary osteoarthritis, right hip: Secondary | ICD-10-CM | POA: Diagnosis not present

## 2023-08-04 DIAGNOSIS — R2689 Other abnormalities of gait and mobility: Secondary | ICD-10-CM | POA: Diagnosis not present

## 2023-08-09 DIAGNOSIS — R2689 Other abnormalities of gait and mobility: Secondary | ICD-10-CM | POA: Diagnosis not present

## 2023-08-09 DIAGNOSIS — M1611 Unilateral primary osteoarthritis, right hip: Secondary | ICD-10-CM | POA: Diagnosis not present

## 2023-08-11 DIAGNOSIS — M1611 Unilateral primary osteoarthritis, right hip: Secondary | ICD-10-CM | POA: Diagnosis not present

## 2023-08-11 DIAGNOSIS — R2689 Other abnormalities of gait and mobility: Secondary | ICD-10-CM | POA: Diagnosis not present

## 2023-08-12 DIAGNOSIS — M1611 Unilateral primary osteoarthritis, right hip: Secondary | ICD-10-CM | POA: Diagnosis not present

## 2023-08-15 DIAGNOSIS — M1611 Unilateral primary osteoarthritis, right hip: Secondary | ICD-10-CM | POA: Diagnosis not present

## 2023-08-15 DIAGNOSIS — R2689 Other abnormalities of gait and mobility: Secondary | ICD-10-CM | POA: Diagnosis not present

## 2023-08-18 DIAGNOSIS — M1611 Unilateral primary osteoarthritis, right hip: Secondary | ICD-10-CM | POA: Diagnosis not present

## 2023-08-18 DIAGNOSIS — R2689 Other abnormalities of gait and mobility: Secondary | ICD-10-CM | POA: Diagnosis not present

## 2023-08-24 DIAGNOSIS — M1611 Unilateral primary osteoarthritis, right hip: Secondary | ICD-10-CM | POA: Diagnosis not present

## 2023-08-24 DIAGNOSIS — R2689 Other abnormalities of gait and mobility: Secondary | ICD-10-CM | POA: Diagnosis not present

## 2023-08-30 DIAGNOSIS — M1611 Unilateral primary osteoarthritis, right hip: Secondary | ICD-10-CM | POA: Diagnosis not present

## 2023-08-30 DIAGNOSIS — R2689 Other abnormalities of gait and mobility: Secondary | ICD-10-CM | POA: Diagnosis not present

## 2023-09-06 DIAGNOSIS — R2689 Other abnormalities of gait and mobility: Secondary | ICD-10-CM | POA: Diagnosis not present

## 2023-09-06 DIAGNOSIS — M1611 Unilateral primary osteoarthritis, right hip: Secondary | ICD-10-CM | POA: Diagnosis not present

## 2023-09-09 DIAGNOSIS — M1611 Unilateral primary osteoarthritis, right hip: Secondary | ICD-10-CM | POA: Diagnosis not present

## 2023-10-11 DIAGNOSIS — Z23 Encounter for immunization: Secondary | ICD-10-CM | POA: Diagnosis not present

## 2023-11-24 DIAGNOSIS — L578 Other skin changes due to chronic exposure to nonionizing radiation: Secondary | ICD-10-CM | POA: Diagnosis not present

## 2023-11-24 DIAGNOSIS — R229 Localized swelling, mass and lump, unspecified: Secondary | ICD-10-CM | POA: Diagnosis not present

## 2023-11-24 DIAGNOSIS — D225 Melanocytic nevi of trunk: Secondary | ICD-10-CM | POA: Diagnosis not present

## 2023-11-24 DIAGNOSIS — Z85828 Personal history of other malignant neoplasm of skin: Secondary | ICD-10-CM | POA: Diagnosis not present

## 2023-11-24 DIAGNOSIS — D485 Neoplasm of uncertain behavior of skin: Secondary | ICD-10-CM | POA: Diagnosis not present

## 2023-11-24 DIAGNOSIS — L570 Actinic keratosis: Secondary | ICD-10-CM | POA: Diagnosis not present

## 2023-11-24 DIAGNOSIS — Z08 Encounter for follow-up examination after completed treatment for malignant neoplasm: Secondary | ICD-10-CM | POA: Diagnosis not present

## 2023-11-24 DIAGNOSIS — L821 Other seborrheic keratosis: Secondary | ICD-10-CM | POA: Diagnosis not present

## 2023-12-09 DIAGNOSIS — M1712 Unilateral primary osteoarthritis, left knee: Secondary | ICD-10-CM | POA: Diagnosis not present

## 2023-12-09 DIAGNOSIS — M1611 Unilateral primary osteoarthritis, right hip: Secondary | ICD-10-CM | POA: Diagnosis not present

## 2024-01-04 DIAGNOSIS — R413 Other amnesia: Secondary | ICD-10-CM | POA: Diagnosis not present

## 2024-02-26 DIAGNOSIS — S86011A Strain of right Achilles tendon, initial encounter: Secondary | ICD-10-CM | POA: Diagnosis not present

## 2024-03-01 DIAGNOSIS — S86011A Strain of right Achilles tendon, initial encounter: Secondary | ICD-10-CM | POA: Diagnosis not present

## 2024-03-02 DIAGNOSIS — S86011D Strain of right Achilles tendon, subsequent encounter: Secondary | ICD-10-CM | POA: Diagnosis not present

## 2024-03-05 DIAGNOSIS — G8918 Other acute postprocedural pain: Secondary | ICD-10-CM | POA: Diagnosis not present

## 2024-03-05 DIAGNOSIS — S86011A Strain of right Achilles tendon, initial encounter: Secondary | ICD-10-CM | POA: Diagnosis not present

## 2024-03-05 DIAGNOSIS — Y999 Unspecified external cause status: Secondary | ICD-10-CM | POA: Diagnosis not present

## 2024-03-05 DIAGNOSIS — X58XXXA Exposure to other specified factors, initial encounter: Secondary | ICD-10-CM | POA: Diagnosis not present

## 2024-03-13 DIAGNOSIS — S86011D Strain of right Achilles tendon, subsequent encounter: Secondary | ICD-10-CM | POA: Diagnosis not present

## 2024-03-21 DIAGNOSIS — M25571 Pain in right ankle and joints of right foot: Secondary | ICD-10-CM | POA: Diagnosis not present

## 2024-03-21 DIAGNOSIS — Z4789 Encounter for other orthopedic aftercare: Secondary | ICD-10-CM | POA: Diagnosis not present

## 2024-03-26 DIAGNOSIS — M25571 Pain in right ankle and joints of right foot: Secondary | ICD-10-CM | POA: Diagnosis not present

## 2024-03-26 DIAGNOSIS — Z4789 Encounter for other orthopedic aftercare: Secondary | ICD-10-CM | POA: Diagnosis not present

## 2024-03-29 DIAGNOSIS — M25571 Pain in right ankle and joints of right foot: Secondary | ICD-10-CM | POA: Diagnosis not present

## 2024-03-29 DIAGNOSIS — Z4789 Encounter for other orthopedic aftercare: Secondary | ICD-10-CM | POA: Diagnosis not present

## 2024-04-02 DIAGNOSIS — M25571 Pain in right ankle and joints of right foot: Secondary | ICD-10-CM | POA: Diagnosis not present

## 2024-04-02 DIAGNOSIS — Z4789 Encounter for other orthopedic aftercare: Secondary | ICD-10-CM | POA: Diagnosis not present

## 2024-04-05 DIAGNOSIS — Z4789 Encounter for other orthopedic aftercare: Secondary | ICD-10-CM | POA: Diagnosis not present

## 2024-04-05 DIAGNOSIS — M25571 Pain in right ankle and joints of right foot: Secondary | ICD-10-CM | POA: Diagnosis not present

## 2024-04-09 DIAGNOSIS — M25571 Pain in right ankle and joints of right foot: Secondary | ICD-10-CM | POA: Diagnosis not present

## 2024-04-09 DIAGNOSIS — Z4789 Encounter for other orthopedic aftercare: Secondary | ICD-10-CM | POA: Diagnosis not present

## 2024-04-12 DIAGNOSIS — Z4789 Encounter for other orthopedic aftercare: Secondary | ICD-10-CM | POA: Diagnosis not present

## 2024-04-12 DIAGNOSIS — M25571 Pain in right ankle and joints of right foot: Secondary | ICD-10-CM | POA: Diagnosis not present

## 2024-04-16 DIAGNOSIS — M25571 Pain in right ankle and joints of right foot: Secondary | ICD-10-CM | POA: Diagnosis not present

## 2024-04-16 DIAGNOSIS — Z4789 Encounter for other orthopedic aftercare: Secondary | ICD-10-CM | POA: Diagnosis not present

## 2024-04-19 DIAGNOSIS — Z4789 Encounter for other orthopedic aftercare: Secondary | ICD-10-CM | POA: Diagnosis not present

## 2024-04-19 DIAGNOSIS — M25571 Pain in right ankle and joints of right foot: Secondary | ICD-10-CM | POA: Diagnosis not present

## 2024-04-24 DIAGNOSIS — M25571 Pain in right ankle and joints of right foot: Secondary | ICD-10-CM | POA: Diagnosis not present

## 2024-04-24 DIAGNOSIS — Z4789 Encounter for other orthopedic aftercare: Secondary | ICD-10-CM | POA: Diagnosis not present

## 2024-04-26 DIAGNOSIS — Z4789 Encounter for other orthopedic aftercare: Secondary | ICD-10-CM | POA: Diagnosis not present

## 2024-04-26 DIAGNOSIS — M25571 Pain in right ankle and joints of right foot: Secondary | ICD-10-CM | POA: Diagnosis not present

## 2024-04-27 DIAGNOSIS — M1712 Unilateral primary osteoarthritis, left knee: Secondary | ICD-10-CM | POA: Diagnosis not present

## 2024-04-30 DIAGNOSIS — Z4789 Encounter for other orthopedic aftercare: Secondary | ICD-10-CM | POA: Diagnosis not present

## 2024-04-30 DIAGNOSIS — M25571 Pain in right ankle and joints of right foot: Secondary | ICD-10-CM | POA: Diagnosis not present

## 2024-05-02 DIAGNOSIS — Z4789 Encounter for other orthopedic aftercare: Secondary | ICD-10-CM | POA: Diagnosis not present

## 2024-05-02 DIAGNOSIS — M25571 Pain in right ankle and joints of right foot: Secondary | ICD-10-CM | POA: Diagnosis not present

## 2024-05-07 DIAGNOSIS — Z4789 Encounter for other orthopedic aftercare: Secondary | ICD-10-CM | POA: Diagnosis not present

## 2024-05-07 DIAGNOSIS — M25571 Pain in right ankle and joints of right foot: Secondary | ICD-10-CM | POA: Diagnosis not present

## 2024-05-10 DIAGNOSIS — Z4789 Encounter for other orthopedic aftercare: Secondary | ICD-10-CM | POA: Diagnosis not present

## 2024-05-10 DIAGNOSIS — M25571 Pain in right ankle and joints of right foot: Secondary | ICD-10-CM | POA: Diagnosis not present

## 2024-05-15 DIAGNOSIS — Z4789 Encounter for other orthopedic aftercare: Secondary | ICD-10-CM | POA: Diagnosis not present

## 2024-05-15 DIAGNOSIS — M25571 Pain in right ankle and joints of right foot: Secondary | ICD-10-CM | POA: Diagnosis not present

## 2024-05-18 DIAGNOSIS — M25571 Pain in right ankle and joints of right foot: Secondary | ICD-10-CM | POA: Diagnosis not present

## 2024-05-18 DIAGNOSIS — Z4789 Encounter for other orthopedic aftercare: Secondary | ICD-10-CM | POA: Diagnosis not present

## 2024-05-21 DIAGNOSIS — Z4789 Encounter for other orthopedic aftercare: Secondary | ICD-10-CM | POA: Diagnosis not present

## 2024-05-21 DIAGNOSIS — M25571 Pain in right ankle and joints of right foot: Secondary | ICD-10-CM | POA: Diagnosis not present

## 2024-05-24 DIAGNOSIS — Z4789 Encounter for other orthopedic aftercare: Secondary | ICD-10-CM | POA: Diagnosis not present

## 2024-05-24 DIAGNOSIS — M25571 Pain in right ankle and joints of right foot: Secondary | ICD-10-CM | POA: Diagnosis not present

## 2024-05-31 DIAGNOSIS — M1712 Unilateral primary osteoarthritis, left knee: Secondary | ICD-10-CM | POA: Diagnosis not present

## 2024-05-31 DIAGNOSIS — Z4789 Encounter for other orthopedic aftercare: Secondary | ICD-10-CM | POA: Diagnosis not present

## 2024-05-31 DIAGNOSIS — M25571 Pain in right ankle and joints of right foot: Secondary | ICD-10-CM | POA: Diagnosis not present

## 2024-06-04 DIAGNOSIS — Z4789 Encounter for other orthopedic aftercare: Secondary | ICD-10-CM | POA: Diagnosis not present

## 2024-06-04 DIAGNOSIS — M25571 Pain in right ankle and joints of right foot: Secondary | ICD-10-CM | POA: Diagnosis not present

## 2024-06-05 DIAGNOSIS — D2261 Melanocytic nevi of right upper limb, including shoulder: Secondary | ICD-10-CM | POA: Diagnosis not present

## 2024-06-05 DIAGNOSIS — L905 Scar conditions and fibrosis of skin: Secondary | ICD-10-CM | POA: Diagnosis not present

## 2024-06-14 DIAGNOSIS — M25571 Pain in right ankle and joints of right foot: Secondary | ICD-10-CM | POA: Diagnosis not present

## 2024-06-14 DIAGNOSIS — Z4789 Encounter for other orthopedic aftercare: Secondary | ICD-10-CM | POA: Diagnosis not present

## 2024-06-18 DIAGNOSIS — M25571 Pain in right ankle and joints of right foot: Secondary | ICD-10-CM | POA: Diagnosis not present

## 2024-06-18 DIAGNOSIS — Z4789 Encounter for other orthopedic aftercare: Secondary | ICD-10-CM | POA: Diagnosis not present

## 2024-06-29 DIAGNOSIS — M1712 Unilateral primary osteoarthritis, left knee: Secondary | ICD-10-CM | POA: Diagnosis not present

## 2024-07-05 DIAGNOSIS — Z6827 Body mass index (BMI) 27.0-27.9, adult: Secondary | ICD-10-CM | POA: Diagnosis not present

## 2024-07-05 DIAGNOSIS — M179 Osteoarthritis of knee, unspecified: Secondary | ICD-10-CM | POA: Diagnosis not present

## 2024-07-25 DIAGNOSIS — Z23 Encounter for immunization: Secondary | ICD-10-CM | POA: Diagnosis not present

## 2024-07-25 DIAGNOSIS — Z131 Encounter for screening for diabetes mellitus: Secondary | ICD-10-CM | POA: Diagnosis not present

## 2024-07-25 DIAGNOSIS — E039 Hypothyroidism, unspecified: Secondary | ICD-10-CM | POA: Diagnosis not present

## 2024-07-25 DIAGNOSIS — Z6827 Body mass index (BMI) 27.0-27.9, adult: Secondary | ICD-10-CM | POA: Diagnosis not present

## 2024-07-25 DIAGNOSIS — R29898 Other symptoms and signs involving the musculoskeletal system: Secondary | ICD-10-CM | POA: Diagnosis not present

## 2024-07-25 DIAGNOSIS — Z1331 Encounter for screening for depression: Secondary | ICD-10-CM | POA: Diagnosis not present

## 2024-07-25 DIAGNOSIS — E7800 Pure hypercholesterolemia, unspecified: Secondary | ICD-10-CM | POA: Diagnosis not present

## 2024-07-25 DIAGNOSIS — Z0000 Encounter for general adult medical examination without abnormal findings: Secondary | ICD-10-CM | POA: Diagnosis not present

## 2024-07-25 DIAGNOSIS — N400 Enlarged prostate without lower urinary tract symptoms: Secondary | ICD-10-CM | POA: Diagnosis not present

## 2024-08-11 DIAGNOSIS — M6281 Muscle weakness (generalized): Secondary | ICD-10-CM | POA: Diagnosis not present

## 2024-08-15 DIAGNOSIS — M6281 Muscle weakness (generalized): Secondary | ICD-10-CM | POA: Diagnosis not present

## 2024-08-22 DIAGNOSIS — M6281 Muscle weakness (generalized): Secondary | ICD-10-CM | POA: Diagnosis not present

## 2024-08-24 DIAGNOSIS — M6281 Muscle weakness (generalized): Secondary | ICD-10-CM | POA: Diagnosis not present

## 2024-08-28 DIAGNOSIS — M6281 Muscle weakness (generalized): Secondary | ICD-10-CM | POA: Diagnosis not present

## 2024-08-30 DIAGNOSIS — M6281 Muscle weakness (generalized): Secondary | ICD-10-CM | POA: Diagnosis not present

## 2024-09-04 DIAGNOSIS — M6281 Muscle weakness (generalized): Secondary | ICD-10-CM | POA: Diagnosis not present

## 2024-09-06 DIAGNOSIS — M6281 Muscle weakness (generalized): Secondary | ICD-10-CM | POA: Diagnosis not present

## 2024-09-11 DIAGNOSIS — M6281 Muscle weakness (generalized): Secondary | ICD-10-CM | POA: Diagnosis not present

## 2024-09-13 DIAGNOSIS — M6281 Muscle weakness (generalized): Secondary | ICD-10-CM | POA: Diagnosis not present

## 2024-09-18 DIAGNOSIS — M6281 Muscle weakness (generalized): Secondary | ICD-10-CM | POA: Diagnosis not present

## 2024-09-20 DIAGNOSIS — M6281 Muscle weakness (generalized): Secondary | ICD-10-CM | POA: Diagnosis not present

## 2024-09-21 DIAGNOSIS — Z23 Encounter for immunization: Secondary | ICD-10-CM | POA: Diagnosis not present

## 2024-09-24 DIAGNOSIS — M25562 Pain in left knee: Secondary | ICD-10-CM | POA: Diagnosis not present

## 2024-09-24 DIAGNOSIS — G8929 Other chronic pain: Secondary | ICD-10-CM | POA: Diagnosis not present

## 2024-09-24 DIAGNOSIS — M1712 Unilateral primary osteoarthritis, left knee: Secondary | ICD-10-CM | POA: Diagnosis not present

## 2024-09-25 DIAGNOSIS — M6281 Muscle weakness (generalized): Secondary | ICD-10-CM | POA: Diagnosis not present

## 2024-09-27 DIAGNOSIS — M6281 Muscle weakness (generalized): Secondary | ICD-10-CM | POA: Diagnosis not present

## 2024-10-11 DIAGNOSIS — G8918 Other acute postprocedural pain: Secondary | ICD-10-CM | POA: Diagnosis not present

## 2024-10-11 DIAGNOSIS — M25762 Osteophyte, left knee: Secondary | ICD-10-CM | POA: Diagnosis not present

## 2024-10-11 DIAGNOSIS — M1712 Unilateral primary osteoarthritis, left knee: Secondary | ICD-10-CM | POA: Diagnosis not present

## 2024-10-15 DIAGNOSIS — M25562 Pain in left knee: Secondary | ICD-10-CM | POA: Diagnosis not present

## 2024-10-15 DIAGNOSIS — Z96652 Presence of left artificial knee joint: Secondary | ICD-10-CM | POA: Diagnosis not present

## 2024-10-15 DIAGNOSIS — R2689 Other abnormalities of gait and mobility: Secondary | ICD-10-CM | POA: Diagnosis not present

## 2024-10-15 DIAGNOSIS — M6281 Muscle weakness (generalized): Secondary | ICD-10-CM | POA: Diagnosis not present

## 2024-10-17 DIAGNOSIS — M25562 Pain in left knee: Secondary | ICD-10-CM | POA: Diagnosis not present

## 2024-10-17 DIAGNOSIS — M6281 Muscle weakness (generalized): Secondary | ICD-10-CM | POA: Diagnosis not present

## 2024-10-17 DIAGNOSIS — R2689 Other abnormalities of gait and mobility: Secondary | ICD-10-CM | POA: Diagnosis not present

## 2024-10-17 DIAGNOSIS — Z96652 Presence of left artificial knee joint: Secondary | ICD-10-CM | POA: Diagnosis not present

## 2024-10-23 DIAGNOSIS — Z96652 Presence of left artificial knee joint: Secondary | ICD-10-CM | POA: Diagnosis not present

## 2024-10-23 DIAGNOSIS — R2689 Other abnormalities of gait and mobility: Secondary | ICD-10-CM | POA: Diagnosis not present

## 2024-10-23 DIAGNOSIS — M25562 Pain in left knee: Secondary | ICD-10-CM | POA: Diagnosis not present

## 2024-10-23 DIAGNOSIS — M6281 Muscle weakness (generalized): Secondary | ICD-10-CM | POA: Diagnosis not present

## 2024-10-25 DIAGNOSIS — R2689 Other abnormalities of gait and mobility: Secondary | ICD-10-CM | POA: Diagnosis not present

## 2024-10-25 DIAGNOSIS — M25562 Pain in left knee: Secondary | ICD-10-CM | POA: Diagnosis not present

## 2024-10-25 DIAGNOSIS — M6281 Muscle weakness (generalized): Secondary | ICD-10-CM | POA: Diagnosis not present

## 2024-10-25 DIAGNOSIS — Z96652 Presence of left artificial knee joint: Secondary | ICD-10-CM | POA: Diagnosis not present

## 2024-10-26 DIAGNOSIS — Z96652 Presence of left artificial knee joint: Secondary | ICD-10-CM | POA: Diagnosis not present

## 2024-10-29 DIAGNOSIS — M25562 Pain in left knee: Secondary | ICD-10-CM | POA: Diagnosis not present

## 2024-10-29 DIAGNOSIS — R2689 Other abnormalities of gait and mobility: Secondary | ICD-10-CM | POA: Diagnosis not present

## 2024-10-29 DIAGNOSIS — Z96652 Presence of left artificial knee joint: Secondary | ICD-10-CM | POA: Diagnosis not present

## 2024-10-29 DIAGNOSIS — M6281 Muscle weakness (generalized): Secondary | ICD-10-CM | POA: Diagnosis not present

## 2024-10-31 DIAGNOSIS — Z96652 Presence of left artificial knee joint: Secondary | ICD-10-CM | POA: Diagnosis not present

## 2024-10-31 DIAGNOSIS — M25562 Pain in left knee: Secondary | ICD-10-CM | POA: Diagnosis not present

## 2024-10-31 DIAGNOSIS — R2689 Other abnormalities of gait and mobility: Secondary | ICD-10-CM | POA: Diagnosis not present

## 2024-10-31 DIAGNOSIS — M6281 Muscle weakness (generalized): Secondary | ICD-10-CM | POA: Diagnosis not present

## 2024-11-02 DIAGNOSIS — M6281 Muscle weakness (generalized): Secondary | ICD-10-CM | POA: Diagnosis not present

## 2024-11-02 DIAGNOSIS — Z96652 Presence of left artificial knee joint: Secondary | ICD-10-CM | POA: Diagnosis not present

## 2024-11-02 DIAGNOSIS — R2689 Other abnormalities of gait and mobility: Secondary | ICD-10-CM | POA: Diagnosis not present

## 2024-11-02 DIAGNOSIS — M25562 Pain in left knee: Secondary | ICD-10-CM | POA: Diagnosis not present

## 2024-11-05 DIAGNOSIS — M6281 Muscle weakness (generalized): Secondary | ICD-10-CM | POA: Diagnosis not present

## 2024-11-05 DIAGNOSIS — M25562 Pain in left knee: Secondary | ICD-10-CM | POA: Diagnosis not present

## 2024-11-05 DIAGNOSIS — R2689 Other abnormalities of gait and mobility: Secondary | ICD-10-CM | POA: Diagnosis not present

## 2024-11-05 DIAGNOSIS — Z96652 Presence of left artificial knee joint: Secondary | ICD-10-CM | POA: Diagnosis not present

## 2024-11-07 DIAGNOSIS — M25562 Pain in left knee: Secondary | ICD-10-CM | POA: Diagnosis not present

## 2024-11-07 DIAGNOSIS — Z96652 Presence of left artificial knee joint: Secondary | ICD-10-CM | POA: Diagnosis not present

## 2024-11-07 DIAGNOSIS — R2689 Other abnormalities of gait and mobility: Secondary | ICD-10-CM | POA: Diagnosis not present

## 2024-11-07 DIAGNOSIS — M6281 Muscle weakness (generalized): Secondary | ICD-10-CM | POA: Diagnosis not present

## 2024-11-09 DIAGNOSIS — Z96652 Presence of left artificial knee joint: Secondary | ICD-10-CM | POA: Diagnosis not present

## 2024-11-09 DIAGNOSIS — M25562 Pain in left knee: Secondary | ICD-10-CM | POA: Diagnosis not present

## 2024-11-09 DIAGNOSIS — R2689 Other abnormalities of gait and mobility: Secondary | ICD-10-CM | POA: Diagnosis not present

## 2024-11-09 DIAGNOSIS — M6281 Muscle weakness (generalized): Secondary | ICD-10-CM | POA: Diagnosis not present

## 2024-11-12 DIAGNOSIS — Z96652 Presence of left artificial knee joint: Secondary | ICD-10-CM | POA: Diagnosis not present

## 2024-11-12 DIAGNOSIS — M25562 Pain in left knee: Secondary | ICD-10-CM | POA: Diagnosis not present

## 2024-11-12 DIAGNOSIS — R2689 Other abnormalities of gait and mobility: Secondary | ICD-10-CM | POA: Diagnosis not present

## 2024-11-12 DIAGNOSIS — M6281 Muscle weakness (generalized): Secondary | ICD-10-CM | POA: Diagnosis not present

## 2024-11-23 DIAGNOSIS — R2689 Other abnormalities of gait and mobility: Secondary | ICD-10-CM | POA: Diagnosis not present

## 2024-11-23 DIAGNOSIS — M25562 Pain in left knee: Secondary | ICD-10-CM | POA: Diagnosis not present

## 2024-11-23 DIAGNOSIS — M6281 Muscle weakness (generalized): Secondary | ICD-10-CM | POA: Diagnosis not present

## 2024-11-23 DIAGNOSIS — Z96652 Presence of left artificial knee joint: Secondary | ICD-10-CM | POA: Diagnosis not present

## 2024-11-26 DIAGNOSIS — D1801 Hemangioma of skin and subcutaneous tissue: Secondary | ICD-10-CM | POA: Diagnosis not present

## 2024-11-26 DIAGNOSIS — L821 Other seborrheic keratosis: Secondary | ICD-10-CM | POA: Diagnosis not present

## 2024-11-26 DIAGNOSIS — Z872 Personal history of diseases of the skin and subcutaneous tissue: Secondary | ICD-10-CM | POA: Diagnosis not present

## 2024-11-26 DIAGNOSIS — Z85828 Personal history of other malignant neoplasm of skin: Secondary | ICD-10-CM | POA: Diagnosis not present

## 2024-11-26 DIAGNOSIS — L814 Other melanin hyperpigmentation: Secondary | ICD-10-CM | POA: Diagnosis not present

## 2024-11-26 DIAGNOSIS — L905 Scar conditions and fibrosis of skin: Secondary | ICD-10-CM | POA: Diagnosis not present

## 2024-11-26 DIAGNOSIS — Z08 Encounter for follow-up examination after completed treatment for malignant neoplasm: Secondary | ICD-10-CM | POA: Diagnosis not present

## 2024-11-27 DIAGNOSIS — M25562 Pain in left knee: Secondary | ICD-10-CM | POA: Diagnosis not present

## 2024-11-27 DIAGNOSIS — R2689 Other abnormalities of gait and mobility: Secondary | ICD-10-CM | POA: Diagnosis not present

## 2024-11-27 DIAGNOSIS — Z96652 Presence of left artificial knee joint: Secondary | ICD-10-CM | POA: Diagnosis not present

## 2024-11-27 DIAGNOSIS — M6281 Muscle weakness (generalized): Secondary | ICD-10-CM | POA: Diagnosis not present

## 2024-11-29 DIAGNOSIS — R2689 Other abnormalities of gait and mobility: Secondary | ICD-10-CM | POA: Diagnosis not present

## 2024-11-29 DIAGNOSIS — M6281 Muscle weakness (generalized): Secondary | ICD-10-CM | POA: Diagnosis not present

## 2024-11-29 DIAGNOSIS — Z96652 Presence of left artificial knee joint: Secondary | ICD-10-CM | POA: Diagnosis not present

## 2024-11-29 DIAGNOSIS — M25562 Pain in left knee: Secondary | ICD-10-CM | POA: Diagnosis not present
